# Patient Record
Sex: Female | Born: 1945 | Race: White | Hispanic: No | Marital: Married
Health system: Southern US, Community
[De-identification: ages and names within clinical notes are randomized; demographics above are authoritative.]

---

## 2007-01-12 ENCOUNTER — Ambulatory Visit: Payer: Self-pay | Admitting: Family Medicine

## 2007-05-11 ENCOUNTER — Ambulatory Visit: Payer: Self-pay | Admitting: Family Medicine

## 2007-06-15 ENCOUNTER — Ambulatory Visit: Payer: Self-pay | Admitting: Family Medicine

## 2007-06-27 ENCOUNTER — Ambulatory Visit: Payer: Self-pay | Admitting: Pain Medicine

## 2007-07-07 ENCOUNTER — Ambulatory Visit: Payer: Self-pay | Admitting: Family Medicine

## 2007-08-27 ENCOUNTER — Inpatient Hospital Stay: Payer: Self-pay | Admitting: Internal Medicine

## 2007-08-27 ENCOUNTER — Other Ambulatory Visit: Payer: Self-pay

## 2007-09-03 ENCOUNTER — Ambulatory Visit: Payer: Self-pay | Admitting: Pain Medicine

## 2007-09-04 ENCOUNTER — Ambulatory Visit: Payer: Self-pay | Admitting: Family Medicine

## 2007-09-28 ENCOUNTER — Ambulatory Visit: Payer: Self-pay | Admitting: Family Medicine

## 2007-10-28 ENCOUNTER — Ambulatory Visit: Payer: Self-pay | Admitting: Ophthalmology

## 2007-10-30 ENCOUNTER — Ambulatory Visit: Payer: Self-pay | Admitting: Ophthalmology

## 2007-12-26 ENCOUNTER — Ambulatory Visit: Payer: Self-pay | Admitting: Vascular Surgery

## 2008-06-10 ENCOUNTER — Emergency Department: Payer: Self-pay | Admitting: Emergency Medicine

## 2008-07-03 ENCOUNTER — Ambulatory Visit: Payer: Self-pay | Admitting: Ophthalmology

## 2008-07-08 ENCOUNTER — Ambulatory Visit: Payer: Self-pay | Admitting: Ophthalmology

## 2008-08-18 ENCOUNTER — Ambulatory Visit: Payer: Self-pay | Admitting: Gastroenterology

## 2010-04-18 ENCOUNTER — Ambulatory Visit: Payer: Self-pay | Admitting: Specialist

## 2010-06-25 ENCOUNTER — Inpatient Hospital Stay: Payer: Self-pay | Admitting: *Deleted

## 2010-07-05 ENCOUNTER — Ambulatory Visit: Payer: Self-pay | Admitting: Family Medicine

## 2010-07-20 ENCOUNTER — Other Ambulatory Visit: Payer: Self-pay | Admitting: Family Medicine

## 2010-08-26 ENCOUNTER — Other Ambulatory Visit: Payer: Self-pay | Admitting: Family Medicine

## 2011-01-21 ENCOUNTER — Other Ambulatory Visit: Payer: Self-pay | Admitting: Internal Medicine

## 2011-01-21 LAB — BASIC METABOLIC PANEL
BUN: 39 mg/dL — ABNORMAL HIGH (ref 7–18)
Creatinine: 1.89 mg/dL — ABNORMAL HIGH (ref 0.60–1.30)
EGFR (Non-African Amer.): 28 — ABNORMAL LOW
Glucose: 165 mg/dL — ABNORMAL HIGH (ref 65–99)
Osmolality: 287 (ref 275–301)
Potassium: 3.7 mmol/L (ref 3.5–5.1)
Sodium: 137 mmol/L (ref 136–145)

## 2011-01-22 ENCOUNTER — Other Ambulatory Visit: Payer: Self-pay | Admitting: Family Medicine

## 2011-01-22 LAB — BASIC METABOLIC PANEL
Anion Gap: 8 (ref 7–16)
BUN: 28 mg/dL — ABNORMAL HIGH (ref 7–18)
Chloride: 102 mmol/L (ref 98–107)
Co2: 27 mmol/L (ref 21–32)
Creatinine: 1.44 mg/dL — ABNORMAL HIGH (ref 0.60–1.30)
EGFR (African American): 47 — ABNORMAL LOW
Osmolality: 286 (ref 275–301)
Potassium: 4.1 mmol/L (ref 3.5–5.1)
Sodium: 137 mmol/L (ref 136–145)

## 2011-04-01 ENCOUNTER — Ambulatory Visit: Payer: Self-pay | Admitting: Family Medicine

## 2011-04-01 LAB — URINALYSIS, COMPLETE
Glucose,UR: NEGATIVE mg/dL (ref 0–75)
Nitrite: NEGATIVE
Protein: 100
Specific Gravity: 1.009 (ref 1.003–1.030)
Squamous Epithelial: 1

## 2011-04-05 LAB — URINE CULTURE

## 2011-07-27 ENCOUNTER — Inpatient Hospital Stay: Payer: Self-pay | Admitting: Internal Medicine

## 2011-07-27 LAB — PROTIME-INR
INR: 1
Prothrombin Time: 13.9 secs (ref 11.5–14.7)

## 2011-07-27 LAB — COMPREHENSIVE METABOLIC PANEL
Alkaline Phosphatase: 76 U/L (ref 50–136)
Anion Gap: 11 (ref 7–16)
BUN: 40 mg/dL — ABNORMAL HIGH (ref 7–18)
Co2: 26 mmol/L (ref 21–32)
Creatinine: 1.69 mg/dL — ABNORMAL HIGH (ref 0.60–1.30)
EGFR (African American): 36 — ABNORMAL LOW
EGFR (Non-African Amer.): 31 — ABNORMAL LOW
Glucose: 104 mg/dL — ABNORMAL HIGH (ref 65–99)
Osmolality: 280 (ref 275–301)
Potassium: 3.7 mmol/L (ref 3.5–5.1)
SGOT(AST): 10 U/L — ABNORMAL LOW (ref 15–37)
Sodium: 135 mmol/L — ABNORMAL LOW (ref 136–145)

## 2011-07-27 LAB — CBC
HCT: 32.9 % — ABNORMAL LOW (ref 35.0–47.0)
HGB: 10.4 g/dL — ABNORMAL LOW (ref 12.0–16.0)
MCHC: 31.5 g/dL — ABNORMAL LOW (ref 32.0–36.0)
MCV: 93 fL (ref 80–100)

## 2011-07-27 LAB — HEMOGLOBIN
HGB: 9.4 g/dL — ABNORMAL LOW (ref 12.0–16.0)
HGB: 10.1 g/dL — ABNORMAL LOW (ref 12.0–16.0)

## 2011-07-27 LAB — URINALYSIS, COMPLETE
Ketone: NEGATIVE
Nitrite: NEGATIVE
Protein: 100
RBC,UR: 30 /HPF (ref 0–5)
WBC UR: 4664 /HPF (ref 0–5)

## 2011-07-28 LAB — BASIC METABOLIC PANEL
Anion Gap: 11 (ref 7–16)
BUN: 35 mg/dL — ABNORMAL HIGH (ref 7–18)
Calcium, Total: 9.4 mg/dL (ref 8.5–10.1)
Chloride: 100 mmol/L (ref 98–107)
Co2: 25 mmol/L (ref 21–32)
Creatinine: 1.47 mg/dL — ABNORMAL HIGH (ref 0.60–1.30)
EGFR (Non-African Amer.): 37 — ABNORMAL LOW
Glucose: 232 mg/dL — ABNORMAL HIGH (ref 65–99)
Osmolality: 287 (ref 275–301)
Potassium: 3.7 mmol/L (ref 3.5–5.1)

## 2011-07-28 LAB — CBC WITH DIFFERENTIAL/PLATELET
Basophil #: 0 10*3/uL (ref 0.0–0.1)
Basophil %: 0.3 %
Eosinophil #: 0.2 10*3/uL (ref 0.0–0.7)
HGB: 11 g/dL — ABNORMAL LOW (ref 12.0–16.0)
Lymphocyte %: 22.9 %
MCH: 29.9 pg (ref 26.0–34.0)
MCHC: 32.2 g/dL (ref 32.0–36.0)
MCV: 93 fL (ref 80–100)
Monocyte #: 1.5 x10 3/mm — ABNORMAL HIGH (ref 0.2–0.9)
Neutrophil #: 6.6 10*3/uL — ABNORMAL HIGH (ref 1.4–6.5)
Neutrophil %: 60.8 %
RDW: 14.1 % (ref 11.5–14.5)

## 2011-07-28 LAB — HEMOGLOBIN: HGB: 10.8 g/dL — ABNORMAL LOW (ref 12.0–16.0)

## 2011-07-29 LAB — BASIC METABOLIC PANEL
Anion Gap: 7 (ref 7–16)
BUN: 21 mg/dL — ABNORMAL HIGH (ref 7–18)
Calcium, Total: 8.4 mg/dL — ABNORMAL LOW (ref 8.5–10.1)
Chloride: 106 mmol/L (ref 98–107)
Co2: 28 mmol/L (ref 21–32)
Creatinine: 1.18 mg/dL (ref 0.60–1.30)
EGFR (African American): 56 — ABNORMAL LOW
Potassium: 3.7 mmol/L (ref 3.5–5.1)
Sodium: 141 mmol/L (ref 136–145)

## 2011-07-29 LAB — CBC WITH DIFFERENTIAL/PLATELET
Basophil #: 0 10*3/uL (ref 0.0–0.1)
Basophil %: 0.4 %
Eosinophil %: 2.9 %
HGB: 9.5 g/dL — ABNORMAL LOW (ref 12.0–16.0)
MCH: 29.9 pg (ref 26.0–34.0)
MCV: 92 fL (ref 80–100)
Monocyte #: 1.3 x10 3/mm — ABNORMAL HIGH (ref 0.2–0.9)
Neutrophil %: 43.5 %
RBC: 3.19 10*6/uL — ABNORMAL LOW (ref 3.80–5.20)
RDW: 13.8 % (ref 11.5–14.5)

## 2011-07-29 LAB — URINE CULTURE

## 2011-07-30 LAB — HEMOGLOBIN: HGB: 10.3 g/dL — ABNORMAL LOW (ref 12.0–16.0)

## 2011-07-31 LAB — HEMOGLOBIN: HGB: 10 g/dL — ABNORMAL LOW (ref 12.0–16.0)

## 2011-08-01 LAB — BASIC METABOLIC PANEL
Anion Gap: 7 (ref 7–16)
BUN: 12 mg/dL (ref 7–18)
Chloride: 94 mmol/L — ABNORMAL LOW (ref 98–107)
Creatinine: 1.02 mg/dL (ref 0.60–1.30)
EGFR (Non-African Amer.): 58 — ABNORMAL LOW
Glucose: 283 mg/dL — ABNORMAL HIGH (ref 65–99)
Potassium: 3.8 mmol/L (ref 3.5–5.1)

## 2011-08-02 LAB — CULTURE, BLOOD (SINGLE)

## 2011-09-19 ENCOUNTER — Emergency Department: Payer: Self-pay | Admitting: Emergency Medicine

## 2011-09-19 LAB — COMPREHENSIVE METABOLIC PANEL
Alkaline Phosphatase: 97 U/L (ref 50–136)
Anion Gap: 12 (ref 7–16)
Bilirubin,Total: 0.8 mg/dL (ref 0.2–1.0)
Calcium, Total: 9.3 mg/dL (ref 8.5–10.1)
Chloride: 88 mmol/L — ABNORMAL LOW (ref 98–107)
Co2: 31 mmol/L (ref 21–32)
Creatinine: 1.19 mg/dL (ref 0.60–1.30)
Osmolality: 282 (ref 275–301)
Potassium: 3.5 mmol/L (ref 3.5–5.1)
Sodium: 131 mmol/L — ABNORMAL LOW (ref 136–145)

## 2011-09-19 LAB — CBC
HCT: 43.8 % (ref 35.0–47.0)
HGB: 14.4 g/dL (ref 12.0–16.0)
MCH: 30.2 pg (ref 26.0–34.0)
MCV: 92 fL (ref 80–100)
Platelet: 334 10*3/uL (ref 150–440)
RBC: 4.75 10*6/uL (ref 3.80–5.20)
WBC: 21.5 10*3/uL — ABNORMAL HIGH (ref 3.6–11.0)

## 2011-09-19 LAB — URINALYSIS, COMPLETE
Bilirubin,UR: NEGATIVE
Glucose,UR: >=500 mg/dL (ref 0–75)
Specific Gravity: 1.023 (ref 1.003–1.030)

## 2011-09-20 LAB — URINE CULTURE

## 2011-10-20 ENCOUNTER — Observation Stay: Payer: Self-pay | Admitting: Internal Medicine

## 2011-10-20 LAB — CK TOTAL AND CKMB (NOT AT ARMC)
CK, Total: 54 U/L (ref 21–215)
CK-MB: 1.3 ng/mL (ref 0.5–3.6)
CK-MB: 1 ng/mL (ref 0.5–3.6)

## 2011-10-20 LAB — COMPREHENSIVE METABOLIC PANEL
Albumin: 3.8 g/dL (ref 3.4–5.0)
Anion Gap: 11 (ref 7–16)
BUN: 16 mg/dL (ref 7–18)
Calcium, Total: 9.8 mg/dL (ref 8.5–10.1)
Chloride: 99 mmol/L (ref 98–107)
Co2: 29 mmol/L (ref 21–32)
EGFR (African American): >60
Glucose: 120 mg/dL — ABNORMAL HIGH (ref 65–99)
Osmolality: 280 (ref 275–301)
Potassium: 4.2 mmol/L (ref 3.5–5.1)
Sodium: 139 mmol/L (ref 136–145)
Total Protein: 7.7 g/dL (ref 6.4–8.2)

## 2011-10-20 LAB — CBC
HGB: 13 g/dL (ref 12.0–16.0)
MCHC: 32.7 g/dL (ref 32.0–36.0)
Platelet: 269 10*3/uL (ref 150–440)
WBC: 10.7 10*3/uL (ref 3.6–11.0)

## 2011-12-14 LAB — COMPREHENSIVE METABOLIC PANEL
Albumin: 4 g/dL (ref 3.4–5.0)
Alkaline Phosphatase: 82 U/L (ref 50–136)
Anion Gap: 8 (ref 7–16)
Bilirubin,Total: 0.5 mg/dL (ref 0.2–1.0)
Calcium, Total: 9.7 mg/dL (ref 8.5–10.1)
Glucose: 354 mg/dL — ABNORMAL HIGH (ref 65–99)
Potassium: 3.4 mmol/L — ABNORMAL LOW (ref 3.5–5.1)
SGOT(AST): 22 U/L (ref 15–37)
SGPT (ALT): 21 U/L (ref 12–78)

## 2011-12-14 LAB — CBC WITH DIFFERENTIAL/PLATELET
Basophil %: 0.1 %
Eosinophil %: 0.1 %
HGB: 13.2 g/dL (ref 12.0–16.0)
Lymphocyte #: 3.3 10*3/uL (ref 1.0–3.6)
MCH: 30.3 pg (ref 26.0–34.0)
MCV: 92 fL (ref 80–100)
Monocyte #: 1.6 x10 3/mm — ABNORMAL HIGH (ref 0.2–0.9)
Neutrophil #: 14.7 10*3/uL — ABNORMAL HIGH (ref 1.4–6.5)
Neutrophil %: 74.5 %
RBC: 4.37 10*6/uL (ref 3.80–5.20)

## 2011-12-14 LAB — URINALYSIS, COMPLETE
Bilirubin,UR: NEGATIVE
Blood: NEGATIVE
RBC,UR: 7 /HPF (ref 0–5)
Squamous Epithelial: NONE SEEN
WBC UR: 1 /HPF (ref 0–5)

## 2011-12-14 LAB — CK TOTAL AND CKMB (NOT AT ARMC)
CK, Total: 74 U/L (ref 21–215)
CK-MB: 2.6 ng/mL (ref 0.5–3.6)

## 2011-12-14 LAB — TROPONIN I: Troponin-I: 0.03 ng/mL

## 2011-12-14 LAB — LIPASE, BLOOD: Lipase: 92 U/L (ref 73–393)

## 2011-12-15 ENCOUNTER — Inpatient Hospital Stay: Payer: Self-pay | Admitting: Internal Medicine

## 2011-12-15 LAB — CBC WITH DIFFERENTIAL/PLATELET
Basophil #: 0.1 10*3/uL (ref 0.0–0.1)
Eosinophil #: 0 10*3/uL (ref 0.0–0.7)
HGB: 10.9 g/dL — ABNORMAL LOW (ref 12.0–16.0)
Lymphocyte #: 3.5 10*3/uL (ref 1.0–3.6)
Lymphocyte %: 14.4 %
MCH: 30.3 pg (ref 26.0–34.0)
MCHC: 32.1 g/dL (ref 32.0–36.0)
Monocyte #: 2.1 x10 3/mm — ABNORMAL HIGH (ref 0.2–0.9)
Monocyte %: 8.5 %
Neutrophil #: 18.8 10*3/uL — ABNORMAL HIGH (ref 1.4–6.5)
Platelet: 282 10*3/uL (ref 150–440)
RBC: 3.61 10*6/uL — ABNORMAL LOW (ref 3.80–5.20)
RDW: 14.4 % (ref 11.5–14.5)

## 2011-12-15 LAB — BASIC METABOLIC PANEL
BUN: 12 mg/dL (ref 7–18)
Calcium, Total: 8.4 mg/dL — ABNORMAL LOW (ref 8.5–10.1)
Chloride: 105 mmol/L (ref 98–107)
Co2: 26 mmol/L (ref 21–32)
Glucose: 319 mg/dL — ABNORMAL HIGH (ref 65–99)
Osmolality: 286 (ref 275–301)
Potassium: 4 mmol/L (ref 3.5–5.1)
Sodium: 137 mmol/L (ref 136–145)

## 2011-12-16 LAB — CBC WITH DIFFERENTIAL/PLATELET
Basophil #: 0 10*3/uL (ref 0.0–0.1)
Basophil %: 0.2 %
Eosinophil #: 0.2 10*3/uL (ref 0.0–0.7)
Eosinophil %: 1.9 %
HCT: 31.5 % — ABNORMAL LOW (ref 35.0–47.0)
HGB: 10.1 g/dL — ABNORMAL LOW (ref 12.0–16.0)
Lymphocyte #: 2.9 10*3/uL (ref 1.0–3.6)
MCH: 29.6 pg (ref 26.0–34.0)
MCHC: 32 g/dL (ref 32.0–36.0)
MCV: 92 fL (ref 80–100)
Monocyte #: 1.1 x10 3/mm — ABNORMAL HIGH (ref 0.2–0.9)
Neutrophil #: 7.5 10*3/uL — ABNORMAL HIGH (ref 1.4–6.5)
Neutrophil %: 63.7 %
RBC: 3.41 10*6/uL — ABNORMAL LOW (ref 3.80–5.20)
RDW: 14.4 % (ref 11.5–14.5)

## 2012-04-20 ENCOUNTER — Inpatient Hospital Stay: Payer: Self-pay | Admitting: Internal Medicine

## 2012-04-20 LAB — URINALYSIS, COMPLETE
Bilirubin,UR: NEGATIVE
Glucose,UR: NEGATIVE mg/dL (ref 0–75)
Ph: 8 (ref 4.5–8.0)
Protein: 100
RBC,UR: 48 /HPF (ref 0–5)
Specific Gravity: 1.015 (ref 1.003–1.030)
Transitional Epi: 1
WBC UR: 789 /HPF (ref 0–5)

## 2012-04-20 LAB — CBC
HGB: 12.3 g/dL (ref 12.0–16.0)
MCV: 91 fL (ref 80–100)
Platelet: 328 10*3/uL (ref 150–440)
RBC: 4.08 10*6/uL (ref 3.80–5.20)
RDW: 13.1 % (ref 11.5–14.5)

## 2012-04-20 LAB — COMPREHENSIVE METABOLIC PANEL
Albumin: 3.5 g/dL (ref 3.4–5.0)
Alkaline Phosphatase: 81 U/L (ref 50–136)
BUN: 19 mg/dL — ABNORMAL HIGH (ref 7–18)
Calcium, Total: 9.8 mg/dL (ref 8.5–10.1)
Chloride: 90 mmol/L — ABNORMAL LOW (ref 98–107)
Glucose: 167 mg/dL — ABNORMAL HIGH (ref 65–99)
Osmolality: 269 (ref 275–301)
Potassium: 3.5 mmol/L (ref 3.5–5.1)
SGOT(AST): 11 U/L — ABNORMAL LOW (ref 15–37)
SGPT (ALT): 15 U/L (ref 12–78)

## 2012-04-20 LAB — LIPASE, BLOOD: Lipase: 72 U/L — ABNORMAL LOW (ref 73–393)

## 2012-04-21 LAB — BASIC METABOLIC PANEL
Anion Gap: 6 — ABNORMAL LOW (ref 7–16)
BUN: 11 mg/dL (ref 7–18)
Calcium, Total: 8.1 mg/dL — ABNORMAL LOW (ref 8.5–10.1)
Creatinine: 0.96 mg/dL (ref 0.60–1.30)
EGFR (African American): >60
Glucose: 47 mg/dL — ABNORMAL LOW (ref 65–99)
Sodium: 135 mmol/L — ABNORMAL LOW (ref 136–145)

## 2012-04-21 LAB — CBC WITH DIFFERENTIAL/PLATELET
Basophil #: 0 10*3/uL (ref 0.0–0.1)
Basophil %: 0.1 %
Eosinophil %: 1.4 %
HGB: 9.6 g/dL — ABNORMAL LOW (ref 12.0–16.0)
Lymphocyte #: 3.5 10*3/uL (ref 1.0–3.6)
MCH: 30.7 pg (ref 26.0–34.0)
Monocyte #: 1.4 x10 3/mm — ABNORMAL HIGH (ref 0.2–0.9)
Neutrophil #: 6.2 10*3/uL (ref 1.4–6.5)
Neutrophil %: 55.3 %
Platelet: 266 10*3/uL (ref 150–440)
RDW: 13.2 % (ref 11.5–14.5)

## 2012-04-22 LAB — URINE CULTURE

## 2012-04-24 LAB — PATHOLOGY REPORT

## 2012-04-26 LAB — CULTURE, BLOOD (SINGLE)

## 2012-07-16 ENCOUNTER — Emergency Department: Payer: Self-pay | Admitting: Emergency Medicine

## 2012-07-17 IMAGING — CR DG CHEST 1V PORT
1 series · 1 of 1 positions shown · non-contrast
Comparison: none

REASON FOR EXAM: Line placement
COMMENTS:

PROCEDURE:     DXR - DXR PORTABLE CHEST SINGLE VIEW  - July 27, 2011  [DATE]
RESULT:     Comparison: 07/27/2011

[portable]
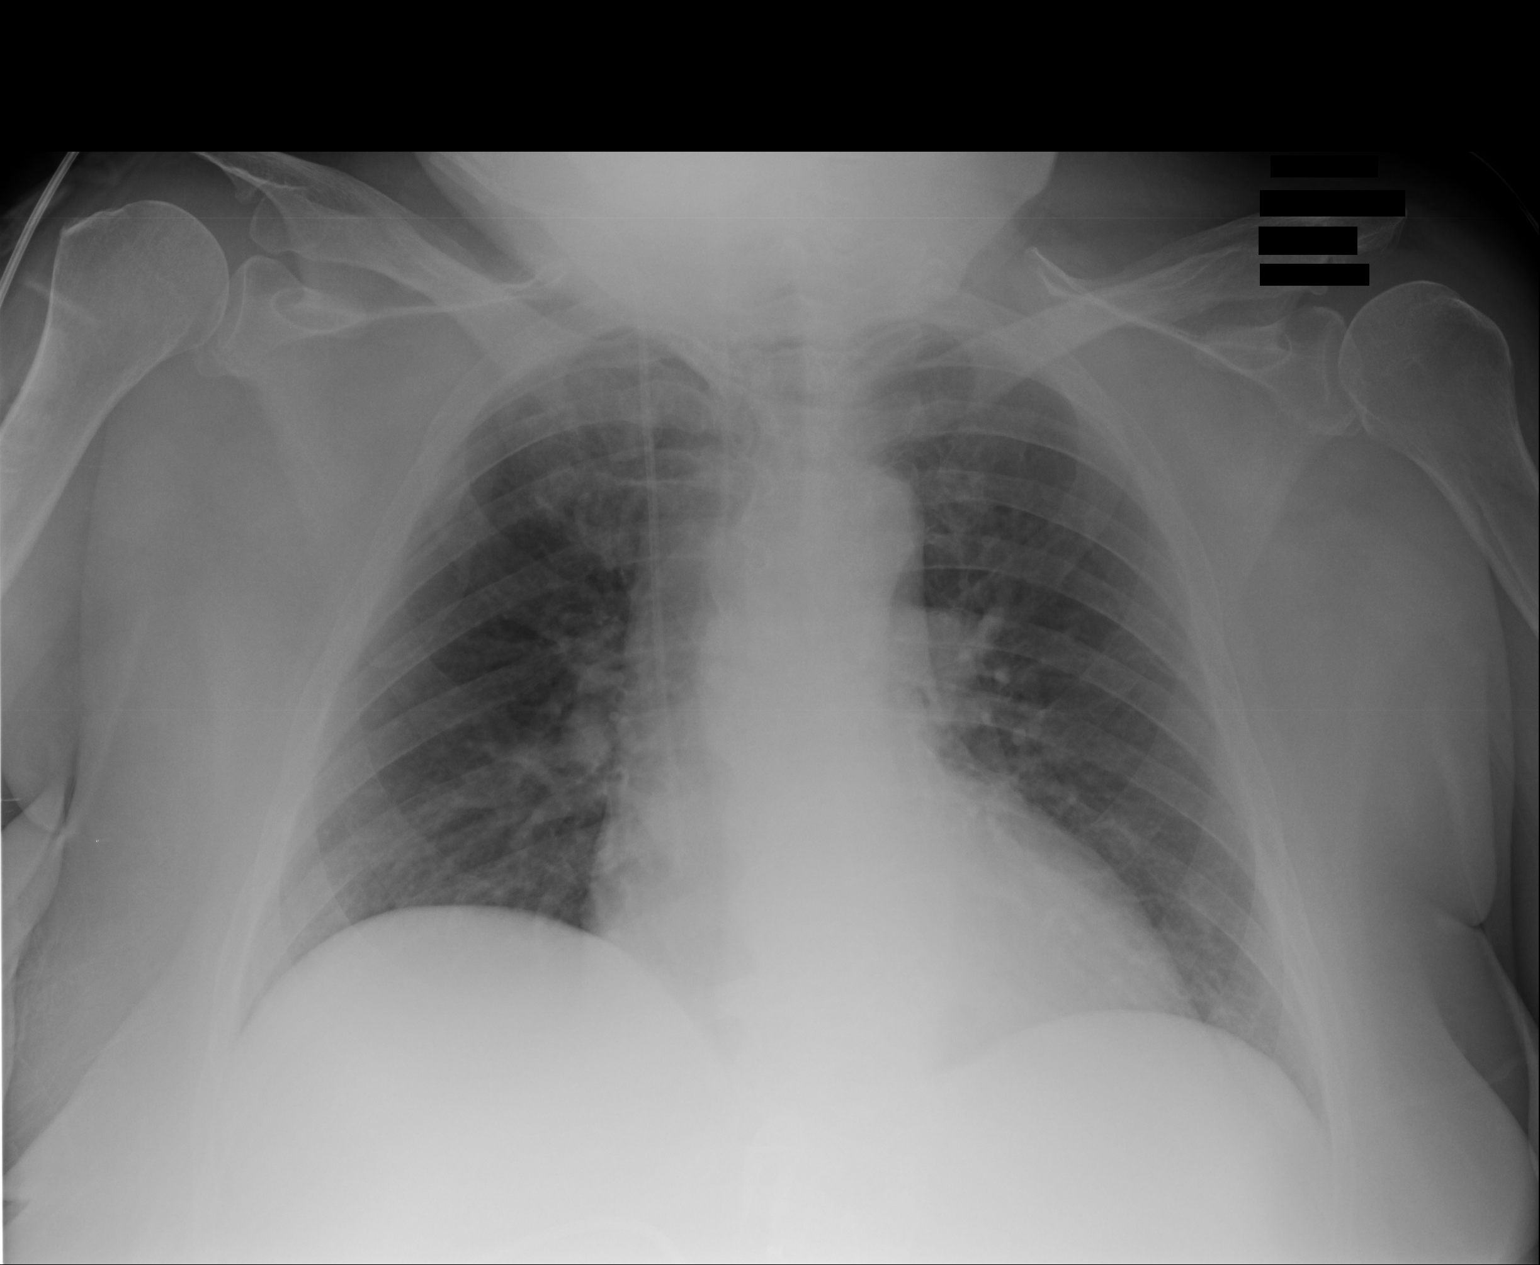

[1 of 1 positions shown; findings below may reference images not displayed]

FINDINGS: Single portable AP chest radiograph is provided. There is a right-sided
central venous catheter with tip projecting over the cavoatrial junction.
There is no focal parenchymal opacity, pleural effusion, or pneumothorax.
Normal cardiomediastinal silhouette. The osseous structures are unremarkable.
IMPRESSION: No acute disease of the che[REDACTED]

## 2012-07-17 IMAGING — CR DG CHEST 1V PORT
1 series · 1 of 1 positions shown · non-contrast
Comparison: none

REASON FOR EXAM: s/p line attempt
COMMENTS:

PROCEDURE:     DXR - DXR PORTABLE CHEST SINGLE VIEW  - July 27, 2011  [DATE]
RESULT:     Comparison: None

[portable]
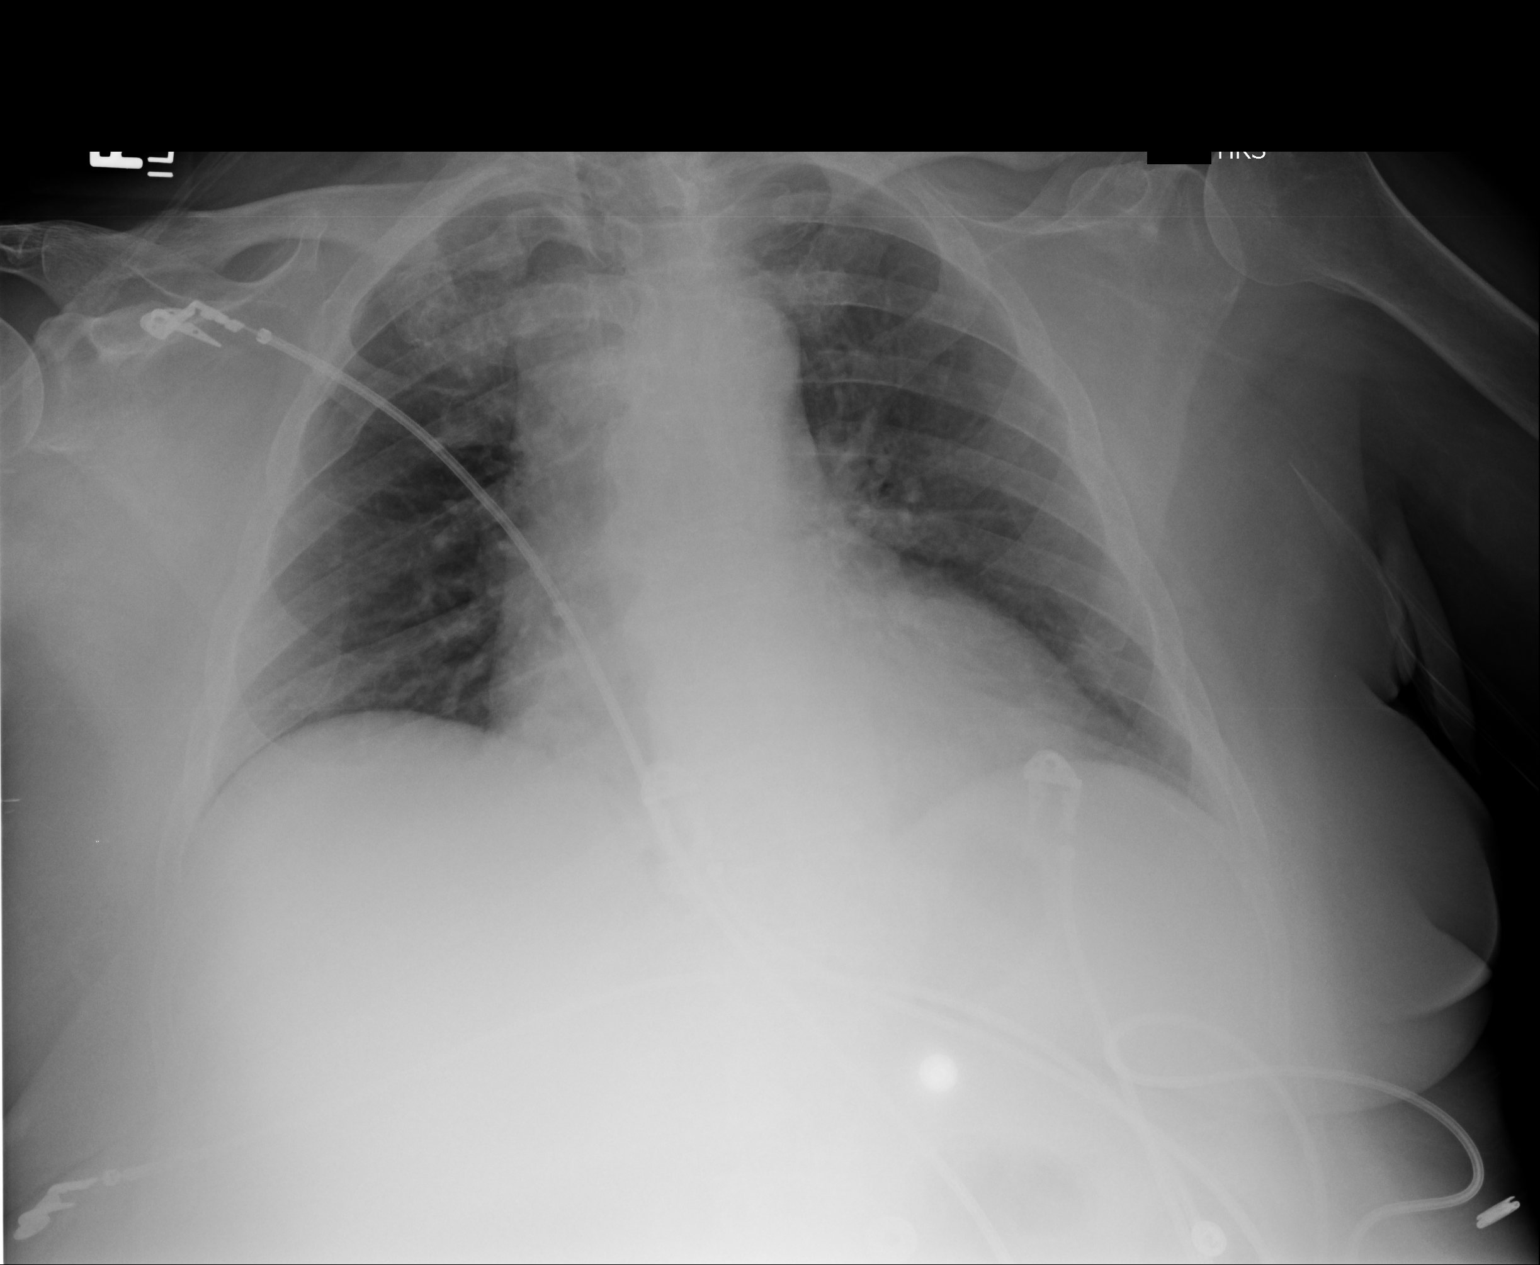

[1 of 1 positions shown; findings below may reference images not displayed]

FINDINGS: Single portable AP chest radiograph is provided.  There is no focal
parenchymal opacity, pleural effusion, or pneumothorax. Normal
cardiomediastinal silhouette. The osseous structures are unremarkable.
IMPRESSION: No acute disease of the che[REDACTED]

## 2012-08-16 DEATH — deceased

## 2012-09-09 IMAGING — CR DG CHEST 2V
1 series · 2 of 2 positions shown · non-contrast
Comparison: none

REASON FOR EXAM: cough
COMMENTS:

[Series 1: x chest ap · 0.14mm/px · 2 of 2 slices shown]
[im 1/2]
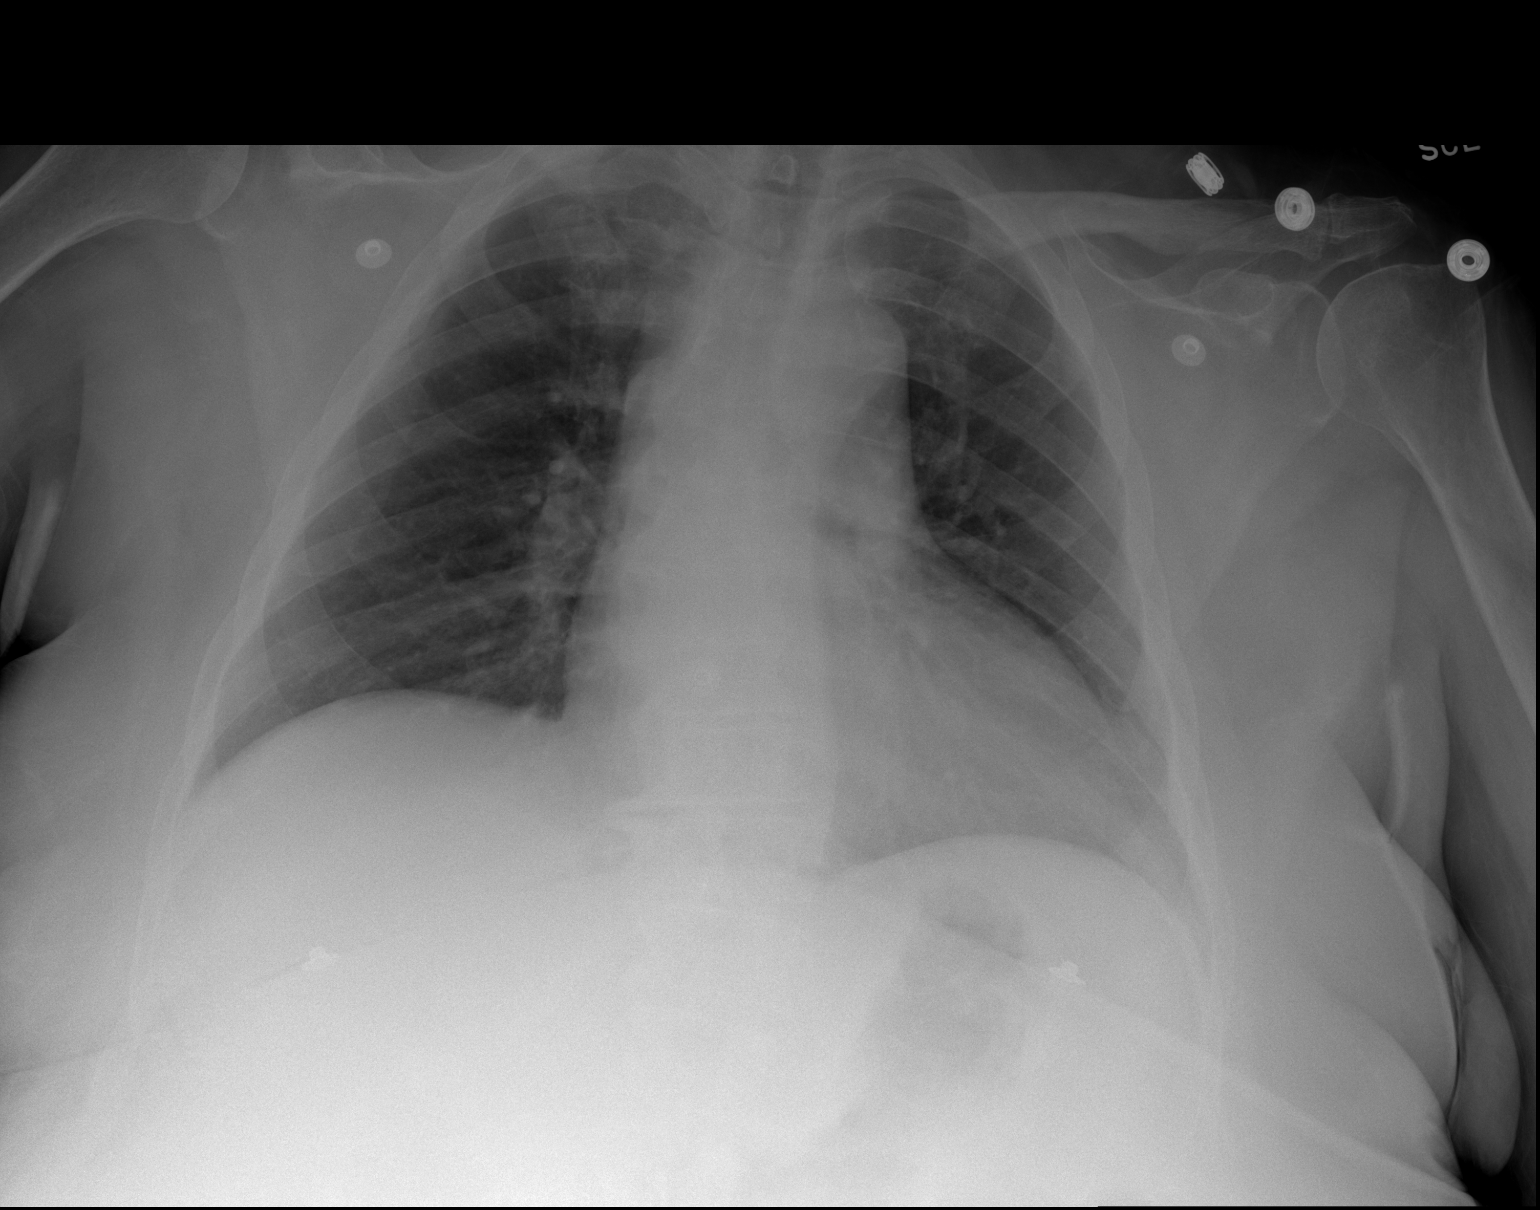
[im 2/2]
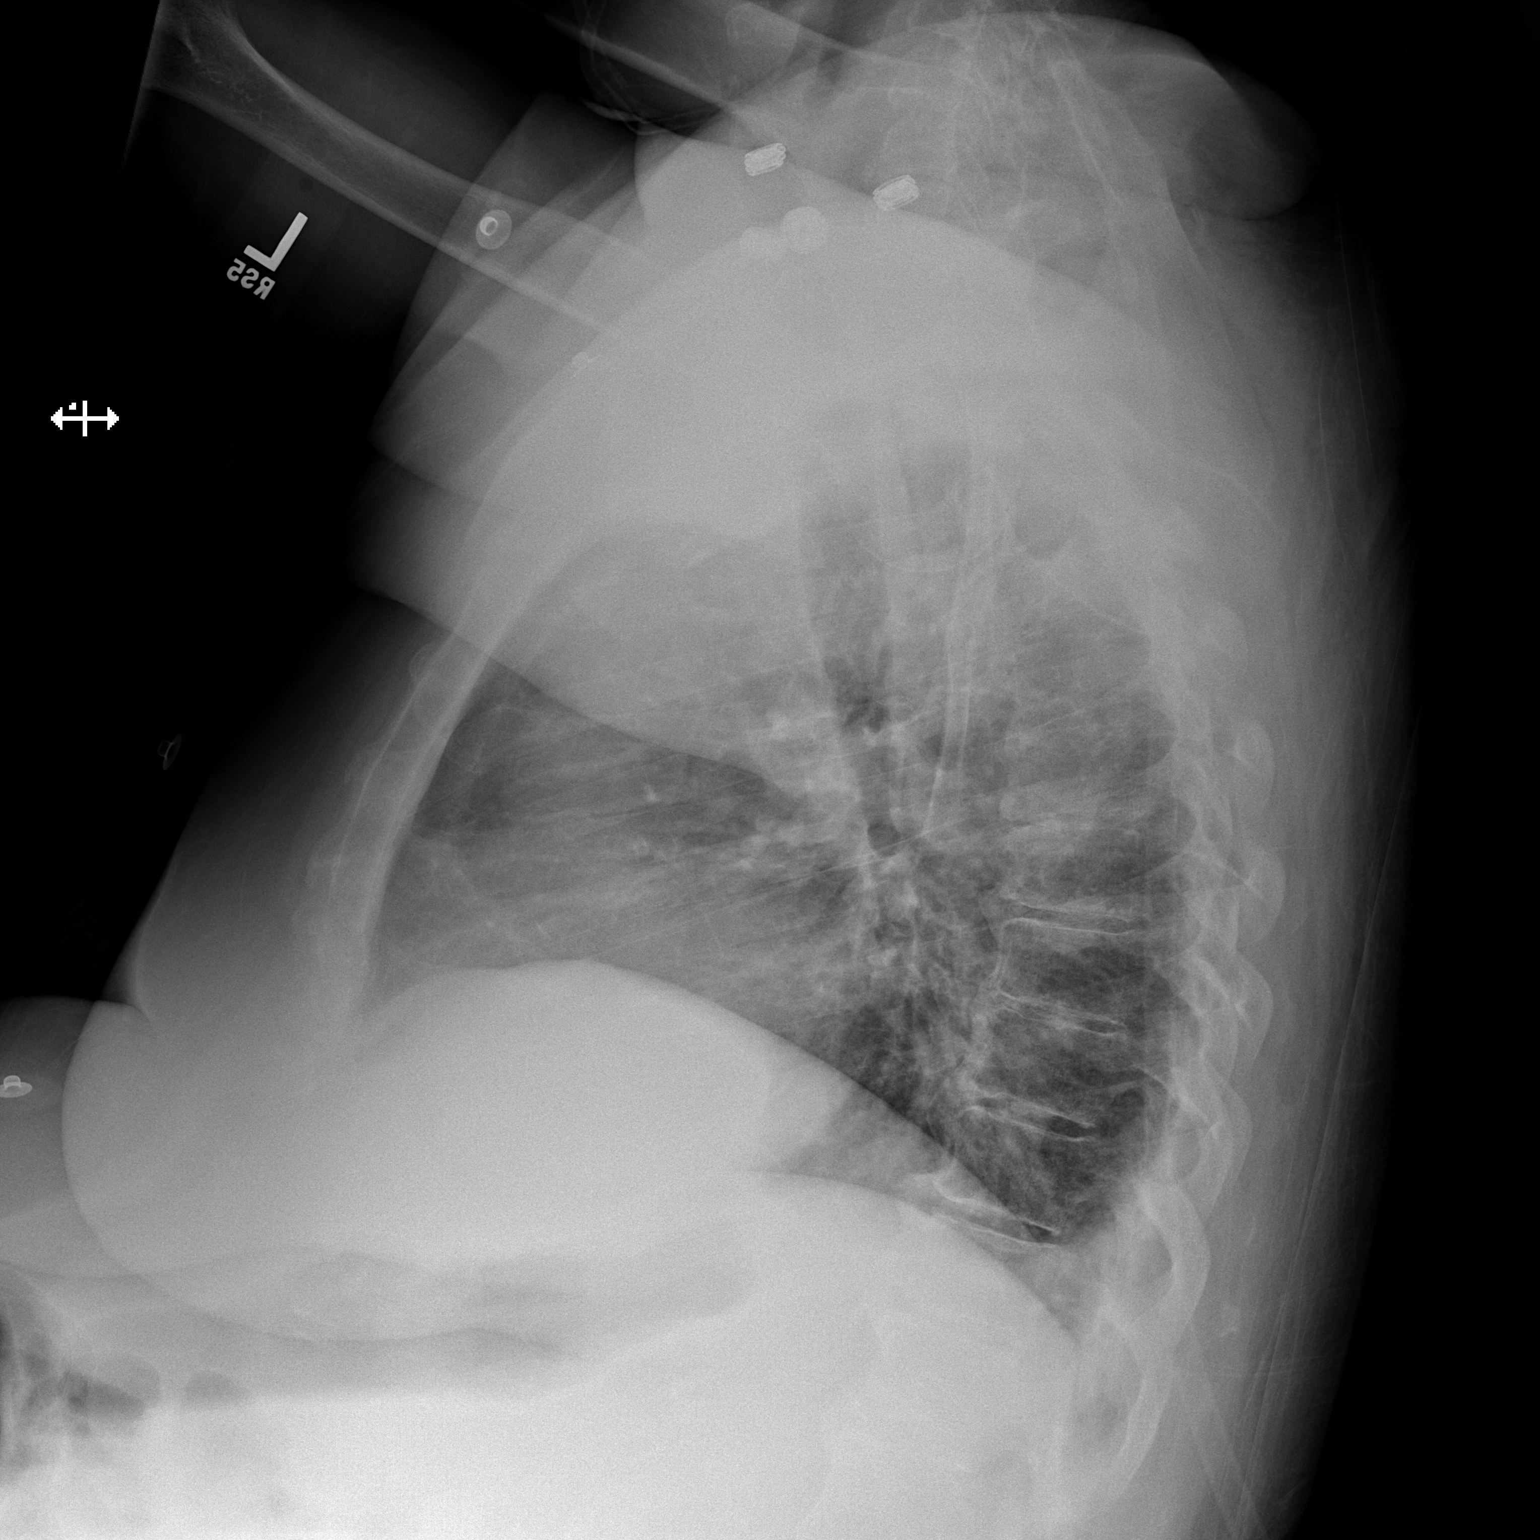

[2 of 2 positions shown; findings below may reference images not displayed]

PROCEDURE:     DXR - DXR CHEST PA (OR AP) AND LATERAL  - September 19, 2011  [DATE]

RESULT:

Comparison is made to a prior study dated 09/27/2011.

The patient has taken a shallow inspiration. With technique taken into
consideration, there is no evidence of focal infiltrates, effusions or
edema. The cardiac silhouette and visualized bony structures are
unremarkable.
IMPRESSION: Shallow inspiration without evidence of acute
cardiopulmonary disease.

## 2012-12-04 IMAGING — CR DG CHEST 2V
1 series · 2 of 2 positions shown · non-contrast
Comparison: none

REASON FOR EXAM: fever
COMMENTS:

[Series 1: x chest ap · 0.14mm/px · 2 of 2 slices shown]
[im 1/2]
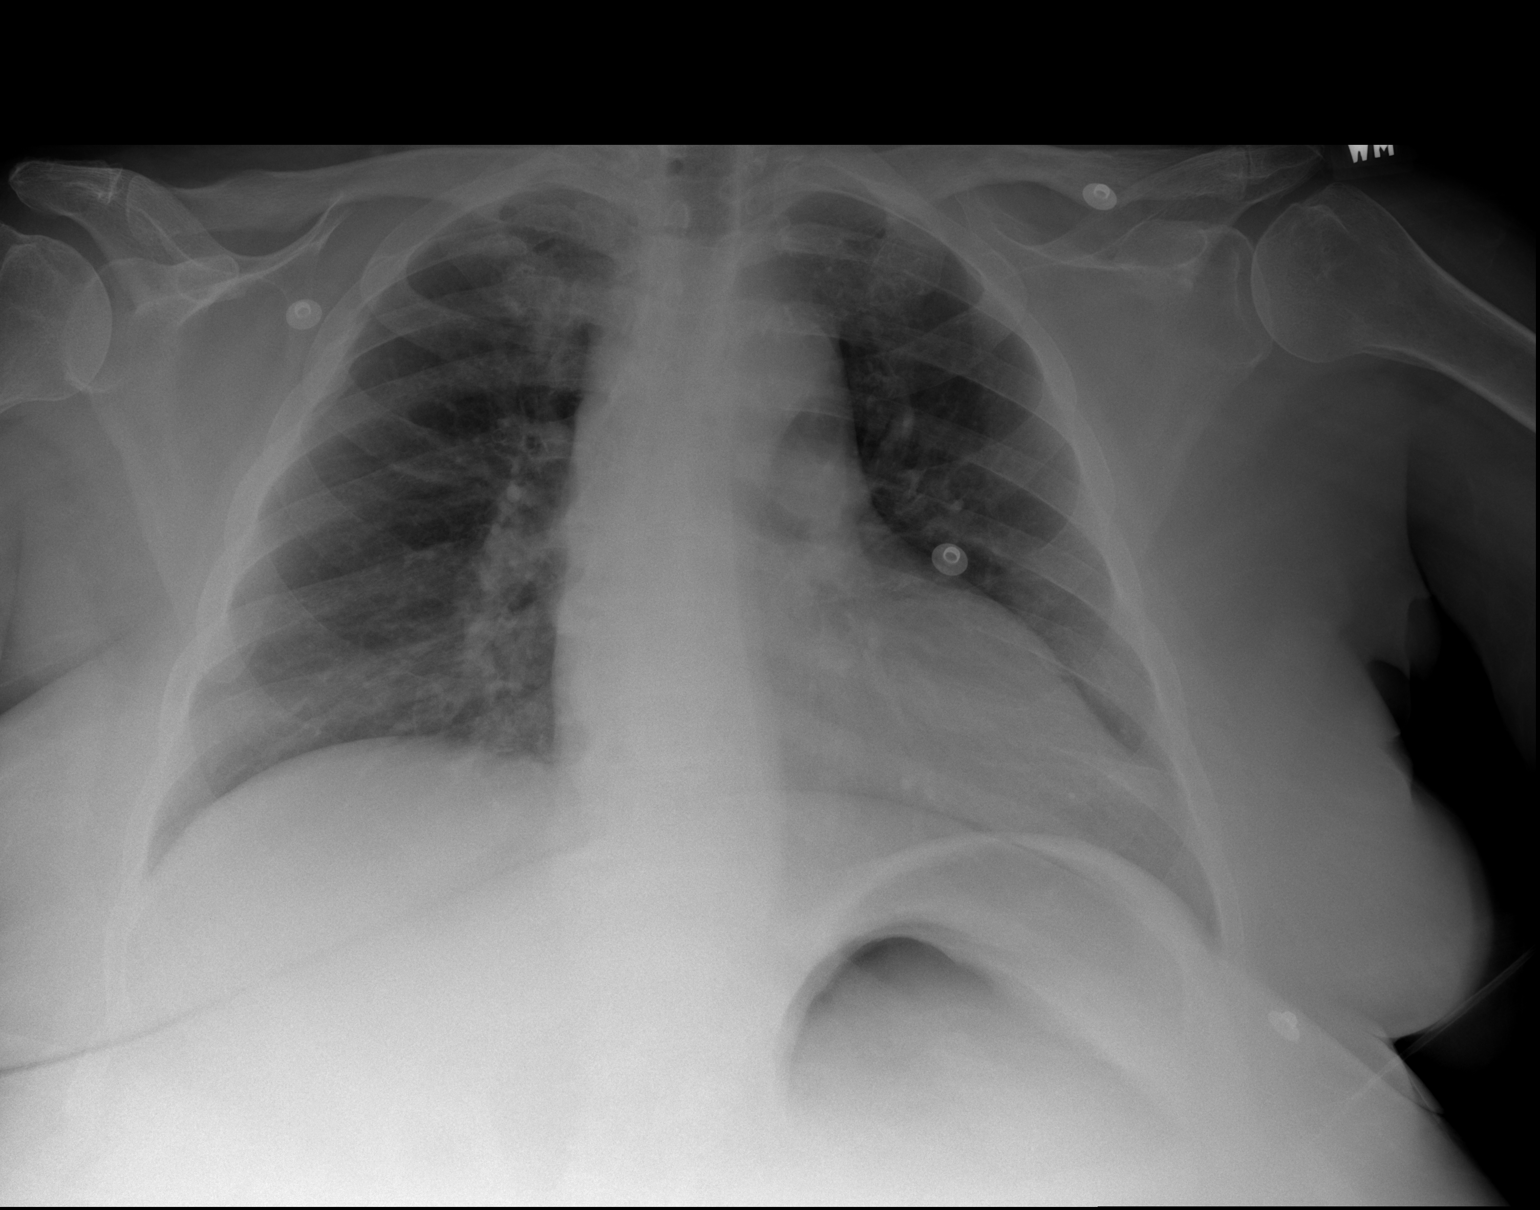
[im 2/2]
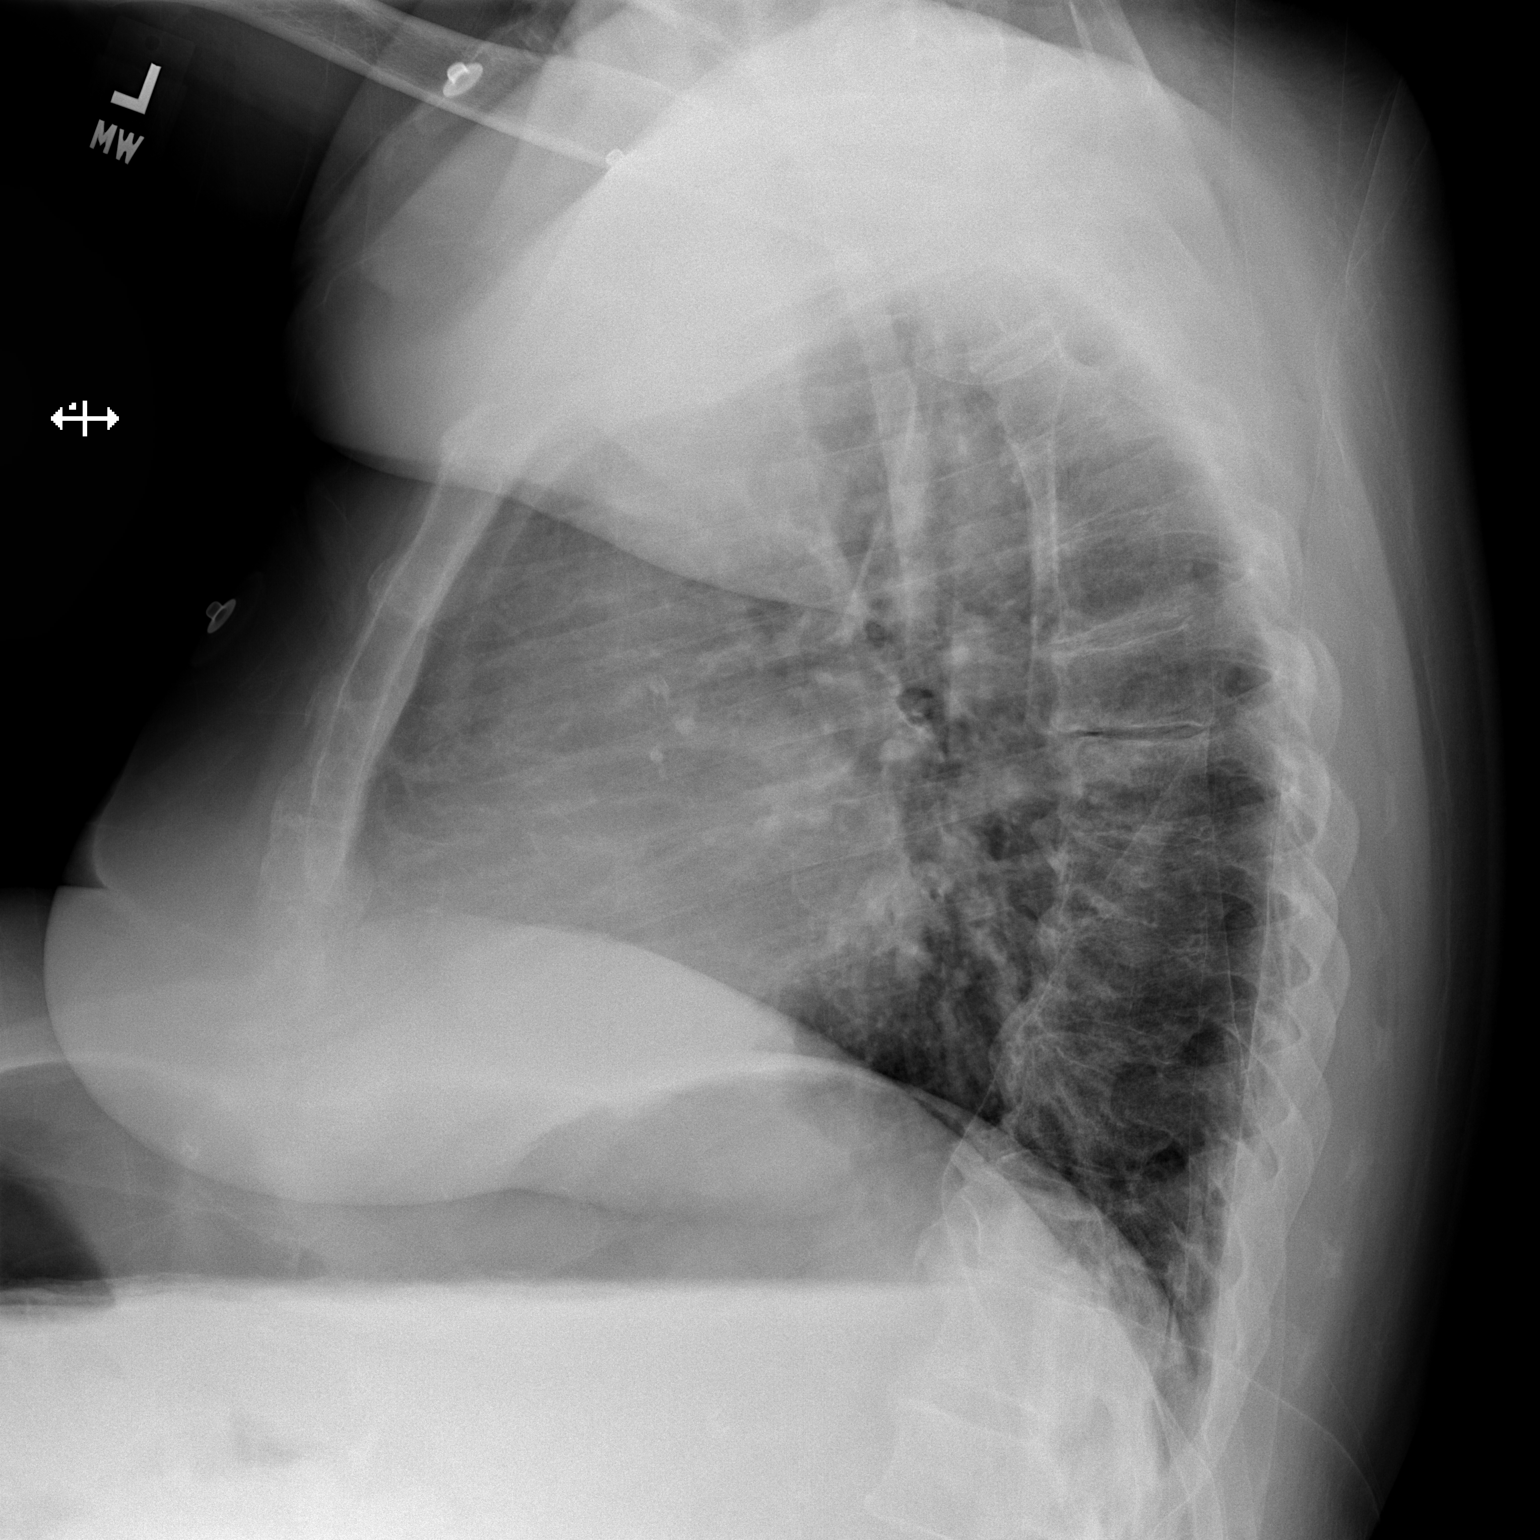

[2 of 2 positions shown; findings below may reference images not displayed]

PROCEDURE:     DXR - DXR CHEST PA (OR AP) AND LATERAL  - December 14, 2011  [DATE]

RESULT:     Comparison is made to study 20 October, 2011.

There is slightly shallow inspiratory effort on the somewhat lordotic
frontal view. The lungs are clear. The heart and pulmonary vessels are
normal. The bony and mediastinal structures are unremarkable. There is no
effusion. There is no pneumothorax or evidence of congestive failure.
IMPRESSION: No acute cardiopulmonary disease.

[REDACTED]

## 2014-05-05 NOTE — Discharge Summary (Signed)
PATIENT NAME:  Mary Werner, Mary Werner MR#:  [id number] DATE OF BIRTH:  1945-12-08  DATE OF ADMISSION:  07/27/2011 DATE OF DISCHARGE:  08/01/2011  ADMITTING DIAGNOSIS: Altered mental status, likely multifactorial.   DISCHARGE DIAGNOSES:  1. Metabolic encephalopathy.  2. Systemic inflammatory response reaction.  3. Acute pyelonephritis.  4. Hypertension.  5. Acute posthemorrhagic anemia due to lower gastrointestinal bleed likely diverticular versus hemorrhoidal, stopped.  6. Status post 1 unit of packed blood cell transfusion.  7. Acute renal failure due to acute pyelonephritis, resolved.  8. Dehydration resolved with intravenous fluids. 9. Hypotension requiring pressors, septic shock, resolved on intravenous fluids as well as antibacterial therapy.  10. History of hyperlipidemia. 11. Hypertension. 12. Diabetes mellitus.  13. Morbid obesity. 14. Cerebrovascular accident. 15. Generalized weakness. 16. Chronic pain syndrome. 17. Anxiety, depression. 18. Urinary incontinence. 19. Right hydroureter. 20. Diabetic neuropathy.   PROCEDURE: Right IJ triple lumen catheter placed on 07/27/2011 by Dr. Michela Pitcher.   DISCHARGE CONDITION: Stable.   DISCHARGE MEDICATIONS: Patient is to resume her outpatient medications which are:  1. Oxycodone 5 mg p.o. twice daily.  2. Milk of magnesia 30 mL at bedtime every second day.  3. Singulair 10 mg p.o. at bedtime.  4. Detrol LA 2 mg p.o. twice daily.  5. Senokot-S 50/8.6 mg 1 tablet twice daily.  6. MS Contin 15 mg p.o. at bedtime.  7. Trazodone 175 mg p.o. at bedtime.  8. Metformin 750 mg p.o. twice daily.  9. Sliding scale insulin Novolin R 28 units injectable once a day. 10. NovoLog mix 70/30, 80 units in the morning and 68 before supper.   11. Abilify 5 mg p.o. daily.  12. Clonazepam 0.5 mg p.o. at bedtime.  13. Simvastatin 10 mg p.o. at bedtime.  14. Magnesium oxide 400 mg p.o. at bedtime.  15. Gabapentin 100 mg at bedtime.  16. Atenolol 50 mg p.o.  twice daily.  17. Vitamin B12 1000 mcg p.o. daily.  18. Calcium with vitamin D 500/200, 1 tablet twice daily.  19. Cranberry oral tablet 1 tablet twice daily.  20. Norvasc 5 mg p.o. daily.  21. Multivitamins once daily.  22. Januvia 100 mg p.o. daily.  23. Klor-Con 20 mEq p.o. daily.  24. Lisinopril 10 mg p.o. daily.  25. Venlafaxine 225 mg p.o. daily.  26. Flonase two sprays once daily.  27. Lamictal 50 mg p.o. twice daily.  28. Iron sulfate 325 mg p.o. twice daily.   ADDITIONAL MEDICATIONS:  1. Keflex 500 mg 3 times daily for six more days.  2. Simethicone 80 mg p.o. before meals and at bedtime as needed.  3. Pyridium 100 mg p.o. t.i.d. for three days.   4. Sliding scale insulin.   NOTE: Patient is not to take Aggrenox for seven days or until recommended by primary care physician.   HOME OXYGEN: None.   DIET: 2 grams salt, low fat, low cholesterol, carbohydrate controlled diet, regular consistency.   ACTIVITY LIMITATIONS: As tolerated. Referral to physical therapy 2 to 7 times a week.   FOLLOW UP: Follow-up appointment with Dr. Terance Hart in two days after discharge.    CONSULTANTS:  1. Dr. Niel Hummer. 2. Dr. Mechele Collin. 3. Care management.  4. Dr. Michela Pitcher.   HISTORY OF PRESENT ILLNESS: Patient is a 69 year old Caucasian female with past medical history significant for history of diabetes mellitus, frequent urinary tract infections who presented to the hospital from [facility] with rectal bleeding as well as being hypotensive, lethargic as well as hypoglycemic. Please refer to  Dr. Hilbert Odor admission note on 07/27/2011. Apparently Patient was lethargic, hypotensive, hypoglycemic and had altered mental status and was admitted to the hospital. Temperature was 98.4 on admission to the Emergency Room, pulse 79, respiration rate 16, blood pressure 97/47 and oxygen saturation was 92% on room air. Physical examination was unremarkable. Patient did have pitting edema in her lower extremities,  however. Was able to move her upper as well as lower extremities.   LABORATORY, DIAGNOSTIC AND RADIOLOGICAL DATA: Chest portable single view 07/27/2011 revealed interstitial pulmonary edema, widening of the mid and superior mediastinum likely secondary to low lung volumes, however, lymphadenopathy could be difficult to exclude. Follow-up PA and lateral chest radiographs are recommended. Repeated chest x-ray portable single view, 07/27/2011 after line attempt showed no acute disease of chest. Repeated chest x-ray, portable single view 07/27/2011 after line placement revealed no acute disease of chest. Right-sided central venous catheter was noted with tip projecting over cavoatrial junction. No focal parenchymal opacity, pleural effusion, or pneumothorax were noted. Normal mediastinal silhouettes was noted. Osseous structures were unremarkable.   Lab studies done on admission, 07/27/2011, showed glucose 104, BUN and creatinine were 40 and 1.69, sodium 135, otherwise BMP was unremarkable. Patient's liver enzymes were remarkable for albumin level of 2.9, otherwise unremarkable. Patient's white blood cell count was elevated to 11.8, hemoglobin 10.4, platelet count 287. Urinalysis revealed yellow turbid urine, negative for glucose, bilirubin or ketones, specific gravity 1.014, pH 5.0, 1+ blood, 100 mg/dL protein, negative for nitrites, 2+ leukocyte esterase, 30 red blood cells, 4664 white blood cells, 3+ bacteria were noted. Patient's EKG showed sinus rhythm with fusion complexes, left axis deviation, 86 beats per minute, left bundle branch block, nonspecific ST-T changes were noted.   HOSPITAL COURSE: Patient was admitted to the hospital with diagnosis of altered mental status likely multifactorial related to hypotension as well as hypoglycemia and infection as well as urinary tract infection and acute renal failure. She was started on IV fluids, however, required pressors, dopamine infusion for blood pressure  control. She had rectal bleed. It was concerning that patient could have had significant bleed and that was contributing to her hypertension. However, with therapy with IV fluid administration as well as dopamine and transfusion of 1 unit of packed red blood cells patient's condition stabilized especially when antibiotics were initiated. It was felt, however, that patient's hypotension was most related to infection, acute pyelonephritis as well as systemic inflammatory response reaction. Patient's blood cultures remained negative during her stay in the hospital time, however, patient's urine cultures came back Escherichia coli sensitive to trimethoprim sulfamethoxazole, cefazolin, also ceftriaxone and imipenem, however, resistant to ampicillin, ciprofloxacin, gentamicin and levofloxacin, intermediate sensitive to nitrofurantoin. Patient was initiated on Rocephin and tolerated this antibiotic well. With this therapy she improved significantly.   In regards to altered mental status. As mentioned above patient's altered mental status was multifactorial, was felt to be due to hypotension, hypoglycemia as well as infection. Patient's all metabolic problems resolved as time progressed patient's neurologic status improved. On day of discharge she is able to communicate. She is alert and seemed to be understanding her current condition. She is able to be discharged to skilled nursing facility to improve her strength with physical therapy.   In regards to gastrointestinal bleed, gastroenterologist was consulted. Dr. Mechele Collin as well as Dr. Niel Hummer followed patient along. Both of them felt that patient's GI bleed very likely was diverticular or hemorrhoidal in nature. They did not feel that patient needs any luminal study but  just follow up. After 1 unit of packed red blood cell transfusion patient's hemoglobin remained stable and after her Aggrenox was placed on hold patient's bleeding slowly subsided. She was noted to  have anemia on arrival to the Emergency Room. Hemoglobin level was 10.4. She was transfused and her hemoglobin level remained stable. On 07/31/2011 it was checked, was 10.0. Patient is to continue iron supplements to restore her iron stores. She is to have her hemoglobin checked sometime in the future by her primary care physician to ensure that it is stable. Patient is to resume her Aggrenox in approximately one week after discharge.   In regards to acute pyelonephritis, patient's urine cultures came back Escherichia coli. Initially the patient was started on ceftriaxone, she received four day therapy of ceftriaxone. Patient's antibiotic now is being changed to Keflex. She is to take Keflex for six more days to complete 10 day course. She will be discharged to rehab today.   In regards to acute renal failure, it was felt to be likely ATN related to hypotension as well as volume loss. Patient received IV fluids as well as pressors and her creatinine improved. On 08/01/2011 patient's BUN and creatinine were 12 and 1.02. It is recommended to follow patient's creatinine levels to ensure its stability especially now since she is back on ACE inhibitors.   For hypotension, it was felt to be due to hypovolemia and sepsis. She was on dopamine as mentioned above, initially, however, patient's dopamine was weaned after a few days in Critical Care Unit and patient was transferred to regular medical floor. She was initially continued on IV fluids, whenever she is able to take p.o. her IV fluids were discontinued. As mentioned above she was hypotensive initially, however, became hypertensive after IV fluids were supplemented. Her blood pressure medications were restarted. It is recommended to follow patient's blood pressure readings and advance patient's blood pressure medications to ensure good control.  In regards to hypoglycemia in diabetic patient, initially patient's metformin, Januvia as well as insulin were placed  on hold, just sliding scale was initiated, however, when patient's oral intake improved all her medications were resumed.   In regards to chronic pain syndrome, initially patient's pain medications were placed on hold, however, later on patient's MS Contin was resumed.  In regards to anxiety, depression, patient is to continue Abilify and Lamictal as well as Klonopin. No changes were made and patient did quite well here.   For hyperlipidemia, patient is to continue simvastatin.   For constipation, patient is to continue stool softeners. Patient will be receiving enema today, on 08/01/2011, prior to discharge and then later if she does have good bowel movement she will be discharged. She did have some diarrhea on arrival to the hospital, however, her diarrhea resolved and now she is constipated.   Patient was complaining of some cough. It was unclear if she had any acute bronchitis, however, we were not able to get any sputum cultures, moreover, she was treated with Rocephin and now she is on Keflex. No complaints over the past few days. No cough and chest x-rays were negative for any infiltrates.   As mentioned above, patient was having diarrheal stool initially, however, stool cultures were negative for C. difficile and stool cultures were also negative for Salmonella, Shigella or Campylobacter or pathogenic Escherichia coli in 48 hours. Patient's diarrhea subsided and patient became even constipated by the end of her stay in the hospital.   On the day of discharge patient  felt satisfactory, did not complain of any significant discomfort. Her vitals were stable. Temperature 97.8, pulse 81, respiratory rate 20, blood pressure 164/88, saturation 98% on 2 liters of oxygen through nasal cannula.   TIME SPENT: 40 minutes.   ____________________________ Katharina Caper, MD rv:cms D: 08/01/2011 14:21:09 ET T: 08/01/2011 15:16:57 ET JOB#: [id number]  cc: Katharina Caper, MD, <Dictator> Teena Irani. Terance Hart,  MD  Katharina Caper MD ELECTRONICALLY SIGNED 08/06/2011 18:42

## 2014-05-05 NOTE — Consult Note (Signed)
CC: abd pain.  Pt with cond abd pain maybe a little better, can cough deeply without signif pain.  On my exam she has some tenderness, no signif guarding.  Also has pain in back.  Enteritis on CT could be infection, can't rule out focal ischemia.  Pt has had 2 strokes in past and complicated diabetes and lives in a rest home for medical assistance.  WBC has come down from 24.6-11.8 on fluids and antibiotics.  Continue current therapy,  Pt asking for something for pain.  Electronic Signatures: Scot JunElliott, Clemie General T (MD)  (Signed on 30-Nov-13 11:45)  Authored  Last Updated: 30-Nov-13 11:45 by Scot JunElliott, Camilo Mander T (MD)

## 2014-05-05 NOTE — H&P (Signed)
PATIENT NAME:  Mary Werner, Mary Werner MR#:  [id number] DATE OF BIRTH:  03-Nov-1945  DATE OF ADMISSION:  12/14/2011  PRIMARY CARE PHYSICIAN: Dr. Dorothey Baseman  CHIEF COMPLAINT: Nausea, vomiting.   HISTORY OF PRESENT ILLNESS: This is a 69 year old female who currently resides at [facility] skilled nursing facility presented to the hospital due to some persistent nausea and vomiting. Patient herself is somewhat lethargic and altered therefore most of the history obtained from the chart and ER physician and also on calling the facility. Upon calling [facility] the nurse notified me that patient had some projectile vomiting earlier today and requested she go to the Emergency Room. In the Emergency Room patient has had no further nausea or vomiting but has been noted to be persistently tachycardic despite getting aggressive IV fluids. She was noted to have a leukocytosis of 19,000. She also seemed a bit lethargic and therefore hospitalist service was contacted for further treatment and evaluation. Patient does complain of some diffuse abdominal pain presently. She denies any chest pain, shortness of breath, any fevers, chills, cough or any other associated symptoms presently. Patient underwent a CT scan of the abdomen and pelvis which showed small bowel enteritis. Hospitalist services were then contacted for further treatment and evaluation.   REVIEW OF SYSTEMS: CONSTITUTIONAL: No documented fever. No weight gain. No weight loss. EYES: No blurred or double vision. ENT: No tinnitus. No postnasal drip. No redness of the oropharynx. RESPIRATORY: No cough, no wheeze, no hemoptysis, no dyspnea. CARDIOVASCULAR: No chest pain, no orthopnea, no palpitations, no syncope. GASTROINTESTINAL: Positive nausea. Positive vomiting. No diarrhea. Positive abdominal pain, generalized. No melena, no hematochezia. GENITOURINARY: No dysuria, no hematuria. ENDOCRINE: No polyuria or nocturia. No heat or cold intolerance. HEME: No anemia,  no bruising, no bleeding. INTEGUMENTARY: No rashes. No lesions. MUSCULOSKELETAL: No arthritis, no swelling, no gout. NEUROLOGIC: No numbness, no tingling, no ataxia, no seizure-type activity. PSYCH: Positive depression. No anxiety, no insomnia, no ADD.   PAST MEDICAL HISTORY:  1. Diabetes.  2. Hypertension.  3. Hyperlipidemia.  4. Chronic pain syndrome, narcotic dependence.  5. Depression.  6. Urinary incontinence.  7. Diabetic neuropathy.   ALLERGIES: Patient allergic to Septra, sulfa drugs, tetracycline and Terramycin.   SOCIAL HISTORY: No smoking. No alcohol abuse. No illicit drug abuse. Currently resides at skilled nursing facility.   FAMILY HISTORY: Currently unobtainable, although she does say that both mother and father died from complications of heart problems, but I am not sure how reliable this is.   CURRENT MEDICATIONS: Patient's current medications are as follows:  1. Abilify 10 mg 1/2 tab daily.  2. Aggrenox 1 tab b.i.d.  3. Atenolol 50 mg b.i.d.  4. Calcium vitamin D 1 tab b.i.d.  5. Chloraseptic spray every four hours as needed.  6. Klonopin 0.5 mg at bedtime.  7. Cranberry  oral tablet 1 tab b.i.d.  8. Flexeril 5 mg q.8 hours as needed.  9. Detrol 2 mg b.i.d.  10. Effexor 225 mg daily.  11. Iron sulfate 325 mg daily. 12. Januvia 100 mg daily.  13. Potassium 20 mEq daily. 14. Lamictal 25 mg 2 tabs twice daily.  15. Lisinopril 20 mg daily.  16. Magnesium oxide 400 mg daily.  17. Metformin 500 mg 1-1/2 tab b.i.d.  18. Milk of magnesia as needed.  19. MS Contin 15 mg b.i.d.  20. Multivitamin daily.  21. Neurontin 100 mg at bedtime.  22. Norvasc 5 mg daily.  23. Novolin regular insulin 28 units daily at noon.  24. NovoLog 70/30, 72 units in the morning, 60 units in the evening.  25. Oxycodone 5 mg b.i.d.  26. Oxycodone 5 mg b.i.d. as needed for pain.  27. Senokot 1 tab b.i.d.  28. Simethicone 80 mg q.i.d. as needed.  29. Simvastatin 10 mg daily.  30. Singulair  10 mg daily.  31. Trazodone 175 mg at bedtime.  32. Victoza 18 mg/3 mL 1.2 mg subcutaneous daily. 33. Vitamin B12 1000 mcg daily.   PHYSICAL EXAMINATION ON ADMISSION:  VITAL SIGNS: Patient's vital signs are noted to be: Temperature 98.2, pulse 124, respirations 22, blood pressure 189/84, sats 97% on 2 liters nasal cannula.   GENERAL: She is a lethargic-appearing female but in no apparent distress.   HEENT: She is atraumatic, normocephalic. Pupils are pinpoint but reactive.   NECK: Supple. There is no jugular venous distention, no bruits, no lymphadenopathy, no thyromegaly.   HEART: Regular rate and rhythm. Tachycardic. No murmurs, no rubs, no clicks.   LUNGS: Clear to auscultation bilaterally. No rales, no rhonchi, no wheezes.   ABDOMEN: Soft, flat, slightly distended. Hyperactive bowel sounds. No hepatosplenomegaly appreciated.   EXTREMITIES: No evidence of any cyanosis, clubbing, or peripheral edema. +2 pedal and radial pulses bilaterally.   NEUROLOGIC: Patient is alert, awake, oriented x1. Difficult to do full neurological exam but moves all extremities spontaneously. As per the nursing staff at the facility she is wheelchair bound.   SKIN: Moist, warm with no rashes.   LYMPHATIC: There is no cervical or axillary lymphadenopathy.   LABORATORY, DIAGNOSTIC AND RADIOLOGICAL DATA: Serum glucose 354, BUN 18, creatinine 1.04, sodium 135, potassium 3.4, chloride 94, bicarbonate 33. LFTs are within normal limits. Troponin 0.03. White cell count 19.7, hemoglobin 13.2, hematocrit 40.2, platelet count 317. Urinalysis within normal limits.   Patient's CT scan of the abdomen and pelvis is suggestive of a small bowel regional enteritis. No evidence of bowel obstruction.   ASSESSMENT AND PLAN: This is a 69 year old female with a history of diabetes, hypertension, chronic pain syndrome and narcotic dependence, diabetic neuropathy, urinary incontinence, hyperlipidemia, depression presents to the  hospital from [facility] secondary to persistent nausea, vomiting, and CT findings suggestive of enteritis.  1. Nausea, vomiting with likely gastroenteritis. Patient's CT scan of the abdomen and pelvis is suggestive of small bowel enteritis. I suspect this is all likely viral in nature. She has no fever but does have a slightly elevated white cell count. For now will continue supportive care with IV fluids, antiemetics. I will avoid giving her too much pain control as she is quite lethargic, is on chronic narcotics. At this point will follow her clinically. I will place her on a clear liquid diet for now.  2. Altered mental status/drowsiness. The etiology is currently unclear but I think this is likely related to polypharmacy. Patient is on high dose narcotics along with multiple psych medications. For now will continue her psych medications but hold her narcotics for now.  3. Leukocytosis. This is likely secondary to the viral gastroenteritis. I will repeat her white cell count in the morning. Hold off on giving any IV antibiotics at this point.  4. Hypertension. Continue lisinopril. Continue atenolol. Patient presently hemodynamically stable.  5. Diabetes. I will place her on sliding scale insulin for now, hold her Victoza, metformin and her scheduled 70/30 insulin given the fact that she will be on a clear liquid diet.  6. Chronic pain syndrome. Patient is on multiple narcotics therefore I will hold those for now given  her mental status and her lethargy.  7. Diabetic neuropathy. Continue Neurontin.  8. Depression. Continue with her Effexor, Abilify and trazodone for now.  9. Hyperlipidemia. Continue simvastatin.  10. Urinary incontinence. Continue Detrol.  11. CODE STATUS: Patient is a FULL CODE.       TIME SPENT: 50 minutes.  ____________________________ Rolly PancakeVivek J. Cherlynn KaiserSainani, MD vjs:cms D: 12/14/2011 20:26:59 ET T: 12/15/2011 06:20:13 ET JOB#: [id number]  cc: Rolly PancakeVivek J. Cherlynn KaiserSainani, MD,  <Dictator> Teena Iraniavid M. Terance HartBronstein, MD Houston SirenVIVEK J Allannah Kempen MD ELECTRONICALLY SIGNED 12/15/2011 14:58

## 2014-05-05 NOTE — Consult Note (Signed)
CC: enteritis.  Pt says her abd pain is pretty much gone, none at the present.  Abd exam shows no peritoneal signs and no signif tenderness to deep palpation.  Can advance diet, home soon on antibiotics and avoid things like seeds, nuts, popcorn.  Should finish a full course of antibiotics.  Electronic Signatures: Scot JunElliott, Josaiah Muhammed T (MD)  (Signed on 01-Dec-13 12:18)  Authored  Last Updated: 01-Dec-13 12:18 by Scot JunElliott, Quintin Hjort T (MD)

## 2014-05-05 NOTE — Discharge Summary (Signed)
PATIENT NAME:  Mary Werner, Mary Werner MR#:  [id number] DATE OF BIRTH:  1945-08-05  DATE OF ADMISSION:  12/15/2011 DATE OF DISCHARGE:  12/19/2011  DISCHARGE DIAGNOSES:  1. Enteritis with nausea and vomiting.  2. Leukocytosis due to above.  3. Altered mental status and drowsiness possibly due to polypharmacy and multiple narcotics and benzodiazepine.  4. Hypertension.  5. Diabetes.  6. Chronic pain syndrome. 7. Diabetic neuropathy. 8. Depression. 9. Hyperlipidemia. 10. Urinary incontinence. 11. History of cerebrovascular accident in the past.   DISPOSITION: The patient is being discharged to her skilled nursing facility.   DIET: Low sodium, 1800 calorie ADA diet.   ACTIVITY: As tolerated.   DISCHARGE FOLLOWUP: Followup with Dr. Terance Hart in 1 to 2 weeks after discharge.   DISCHARGE MEDICATIONS:  1. Aggrenox 1 capsule twice a day. 2. Abilify 10 mg 1/2 tablet once a day. 3. Atenolol 50 mg one tablet twice a day.  4. Calcium with vitamin D 500/200 one tablet twice a day. 5. Cranberry oral tablet 450 mg twice a day. 6. Detrol 2 mg twice a day.  7. Januvia 100 mg daily.  8. KCl 20 mEq daily.  9. Lamictal 50 mg twice a day. 10. Lisinopril 20 mg daily.  11. Magnesium oxide 400 mg daily.  12. Metformin 750 mg twice a day. 13. Multivitamin 1 tablet daily.  14. Neurontin 100 mg at bedtime.  15. Norvasc 5 mg daily.  16. NovoLog 70/30 72 units in the morning and 60 units in the evening.  17. Senokot S one tablet twice a day.  18. Simvastatin 10 mg daily.  19. Singulair 10 mg daily. 20. Effexor XR 225 mg once a day.  21. Milk of Magnesia 30 mL every other day at bedtime. 22. Trazodone 175 mg at bedtime. 23. Vitamin B12 1000 mcg daily. 24. Victoza 1.2 mg subcutaneously once a day.  25. Ferrous sulfate 325 mg daily.  26. Simethicone 80 mg four times daily as needed.  27. Chloraseptic spray every 4 hours as needed for sore throat.  28. Oxycodone 5 mg every 4 hours p.r.n. pain. 29. Flagyl  500 mg every 8 hours for 10 days.  30. Cipro 500 mg every 12 hours for 10 days. 31. Tylenol 650 mg every four hours p.r.n.  32. Zofran 4 mg every six hours p.r.n. for nausea and vomiting. 33. DuoNebs every 6 hours p.r.n. wheezing. 34. MiraLax 17 grams daily p.r.n. for constipation.   CONSULTANT: Lurline Del, MD - Gastroenterology.   RESULTS: CT of the abdomen showed findings compatible with small bowel enteritis, large gastric air fluid level which may represent a component of paresis, which could be reactive considering the small bowel findings. Stable cystic right adnexal mass. Nodular thickening of the posterior wall of the rectum on the left. Thickening of the distal esophagus, nonspecific, although esophagitis cannot be excluded.  Chest x-ray showed no acute cardiopulmonary pathology.   Microbiology: Blood cultures no growth so far.   Urinalysis showed no evidence of infection.   White count 19.7 on admission and 11.8 by the time discharge. Hemoglobin 13.2 to 10. Platelet count normal. Glucose 319, BUN 18, creatinine 1.04, sodium normal by the time of discharge, and potassium 3.4 and normal by the time of discharge. Lipase normal. LFTs normal. Cardiac enzymes normal.   HOSPITAL COURSE: The patient is a 69 year old female with history of cerebrovascular accident in the past, diabetes, hypertension, and chronic pain syndrome who was admitted for altered mental status and nausea and vomiting. The altered  mental status was felt to be due to polypharmacy. The patient was on multiple medications including Neurontin, trazodone, Effexor, MS Contin, Klonopin, and oxycodone for pain. All her narcotic and anxiolytic medications were placed on hold. The patient was back to her baseline within 24 hours of doing so. She had nausea, vomiting, and abdominal pain. CT of the abdomen showed small bowel enteritis. Her white count was significantly elevated, therefore, she was started on empiric antibiotics  of Cipro and Flagyl with good response. She was also evaluated by Dr. Niel HummerIftikhar and Dr. Mechele CollinElliott since she had additional findings on the CT including possible gastroparesis and thickening of the distal esophagus. No luminal evaluation was recommended or planned. With conservative management and antibiotics, the patient's nausea, vomiting, and abdominal pain resolved. She was tolerating a regular diet by the time of discharge. Her other medical problems including hypertension, diabetes, and depression remained stable. She is being discharged to her skilled facility in stable condition.   TIME SPENT: 45 minutes.  ____________________________ Darrick MeigsSangeeta Tierney Behl, MD sp:slb D: 12/19/2011 08:23:41 ET T: 12/19/2011 08:57:51 ET JOB#: [id number]  cc: Darrick MeigsSangeeta Raghav Verrilli, MD, <Dictator> Darrick MeigsSANGEETA Wayman Hoard MD ELECTRONICALLY SIGNED 12/19/2011 12:12

## 2014-05-05 NOTE — Consult Note (Signed)
PATIENT NAME:  Mary Werner, BOTTOMLEY MR#:  [id number] DATE OF BIRTH:  October 04, 1945  DATE OF CONSULTATION:  12/15/2011  REFERRING PHYSICIAN:   CONSULTING PHYSICIAN:  Lurline Del, MD  REASON FOR CONSULTATION: Nausea and vomiting.   HISTORY OF PRESENT ILLNESS: A 69 year old female, resident of [facility] skilled nursing facility. The patient was transferred because of persistent nausea and vomiting. According to the patient, for the last couple of days she has been having some nausea and vomiting. She denies significant abdominal pain and mainly complaining of back pain. According to her, she has no trouble with diarrhea or constipation. CT scan of the abdomen and pelvis done in the emergency room raised some concerns about possible enteritis. Increased amount of air was noted in the stomach raising concerns about possible ileus. The patient's UA showed 2+ ketones, and more than 500 of glucose in the urine. Serum glucose was about 319. The patient is being admitted for further management.   PAST MEDICAL HISTORY:  Significant for: 1. Diabetes. 2. Hypertension.  3. Hyperlipidemia.  4. Chronic pain syndrome. 5. Depression. 6. Diabetic neuropathy. 7. Urinary incontinence.   ALLERGIES: Septra, tetracycline, and Terramycin.   SOCIAL HISTORY: She is a nursing home resident. Does not smoke or drink.   FAMILY HISTORY: Unremarkable.   MEDICATIONS AT HOME:  1. Abilify. 2. Aggrenox. 3. Atenolol. 4. Calcium. 5. Klonopin. 6. Flexeril. 7. Detrol. 8. Effexor. 9. Iron. 10. Januvia. 11. Potassium. 12. Lamictal. 13. Lisinopril. 14. Magnesium. 15. Metformin. 16. MS Contin. 17. Neurontin. 18. Norvasc. 19. Novolin.   20. NovoLog. 21. Oxycodone. 22. Senokot. 23. Simethicone. 24. Simvastatin. 25. Singulair. 26. Trazodone. 27. Victoza. 28. Vitamin B12.   REVIEW OF SYSTEMS: Positive for back pain. Denies diarrhea, constipation.  Review of systems is also positive for recent nausea,  vomiting, and mild abdominal pain.   PHYSICAL EXAMINATION:  GENERAL: Obese female. She does not appear to be in any acute distress. She is drinking her clear liquids.   HEENT: Grossly unremarkable.   NECK: Neck veins are flat.   LUNGS: Grossly clear to auscultation bilaterally.   CARDIOVASCULAR: Regular rate and rhythm.   ABDOMEN: Somewhat distended. Mild diffuse tenderness was noted. Bowel sounds are very sluggish. No rebound or guarding was noted.   NEUROLOGIC EXAMINATION: She is quite awake and alert.   LABORATORIES: White cell count was 19,000 on admission; today is 24,000. Hemoglobin was 13 on admission, is 10.9 after hydration. Serum glucose 319.  Troponin normal. Serum lipase normal. Serum glucose was 354 yesterday. Blood cultures are negative. Urinalysis showed more than 500 mg/dL of glucose, 2+ ketones, and positive protein. CT scan of the abdomen and pelvis showed multiple loops of distended small bowel with thickening of the jejunum and distal duodenum.  No evidence of obstruction. The stomach is air filled. Small amount of fluid within the pelvis. Some degree of fecal retention was noted within the colon. The main vessels, SMV,  SMA, and IMA, appear to be patent.   ASSESSMENT AND PLAN: Patient with acute onset nausea and vomiting and some abdominal pain. There is no diarrhea. She has significant leukocytosis. The patient may be dehydrated, and we may be dealing with a case of ketoacidosis as suggested by abnormal urinalysis and high blood sugars. CT scan is somewhat abnormal, although the increased amount of gas in the stomach may be secondary to gastroparesis. Some thickening of the wall of the small bowel was noted, raising concerns about enteritis. Exact etiology is not known. Fecal impaction may be  contributing as well.   I would recommend conservative management at this time with IV hydration. Okay to continue IV antibiotics. Repeat CBC in a.m. If patient has no bowel movement  in the next 24 to 48 hours, then I would recommend using enemas plus/minus laxatives. Mesenteric ischemia was considered but appears to be unlikely. A CT scan showed good opacification of major vessels. We will follow. Further recommendations to follow.   ____________________________ Lurline DelShaukat Glorianne Proctor, MD si:vtd D: 12/15/2011 18:02:00 ET T: 12/16/2011 09:34:33 ET JOB#: [id number]  cc: Lurline DelShaukat Doyce Stonehouse, MD, <Dictator> Lurline DelSHAUKAT Jolane Bankhead MD ELECTRONICALLY SIGNED 12/20/2011 11:25

## 2014-05-05 NOTE — Discharge Summary (Signed)
PATIENT NAME:  Mary Werner, MARCHETTI MR#:  [id number] DATE OF BIRTH:  12-Nov-1945  DATE OF ADMISSION:  10/20/2011 DATE OF DISCHARGE:  10/20/2011  PRESENTING COMPLAINT: Chest pain.   DISCHARGE DIAGNOSES:  1. Chest pain, appears atypical, could be musculoskeletal in the setting of chronic cough.  2. History of chronic pain syndrome with narcotic dependence.  3. Hyperlipidemia.  4. Morbid obesity.  5. Depression.  6. Type 2 diabetes.  7. History of right pelvic cystic mass.   CONDITION ON DISCHARGE: Fair.   CODE STATUS: FULL CODE.   VITAL SIGNS: Vitals are stable. Blood pressure 127/72, sats are 96% on room air.   LABORATORY, DIAGNOSTIC AND RADIOLOGICAL DATA: Cardiac enzymes x2 negative. Chest x-ray no acute cardiopulmonary abnormality. No alveolar infiltrate. CBC within normal limits. Comprehensive metabolic panel within normal limits. EKG showed normal sinus rhythm with left axis deviation and left ventricular hypertrophy with QRS widening and repolarization. No changes from previous EKGs.   DISCHARGE MEDICATIONS:  1. Milk of magnesium 30 mL p.o. daily.  2. Azithromycin 250 mg 2 tablets, finish up your course.  3. Calcium with vitamin D 1 tablet b.i.d.  4. Clonazepam 0.5 mg at bedtime.  5. Cranberry oral tablet 1 tablet 450 mg twice a day.  6. Cyclobenzaprine 1 tablet every eight hours.  7. Detrol 2 mg 1 tablet b.i.d.  8. Effexor-XR 225 mg p.o. daily.  9. Novolin R sliding scale.  10. Ferrous sulfate 325 mg 1 tablet b.i.d.  11. Januvia 100 mg daily.  12. Klor-Con 20 mEq p.o. daily.  13. Lamictal 25 mg 2 tablets twice a day.  14. Lisinopril 20 mg 1 tablet daily.  15. Magnesium oxide 1 tablet at bedtime.  16. Metformin 500 mg 1-1/2 tablets, that is 750 mg, p.o. b.i.d.  17. Aggrenox 25/200, 1 tablet b.i.d.  18. Abilify 10 mg 1/2 tablet, which is 5 mg, daily.  19. Atenolol 50 mg b.i.d.  20. MS Contin 15 mg 1 tablet at bedtime.  21. Mucinex DM 1 tablet b.i.d.  22. Multivitamin p.o.  daily.  23. Neurontin 100 mg at bedtime.  24. Norvasc 5 mg daily.  25. NovoLog R 28 units daily at noon.  26. NovoLog Mix 70/30, 72 units once a day in the morning and 60 units in the evening.  27. Oxycodone 5 mg b.i.d.  28. Senokot-S 1 tablet b.i.d.  29. Simvastatin 10 mg at bedtime.  30. Singulair 10 mg daily.   DIET: Low sodium, carbohydrate controlled diet.   FOLLOW UP: Patient will follow up with Dr. Terance Hart 10/15 at 10:45 a.m. and Dr. Lady Gary 10/21 at 10:45 a.m.   BRIEF SUMMARY OF HOSPITAL COURSE: Ms. Eckhart is a 69 year old morbidly obese Caucasian female with multiple medical problems comes to the Emergency Room from [facility] with complaints of chest pain. She was admitted with:  1. Chest pain. Patient's pain from her description appears atypical. It currently is resolved. She was ruled out for acute ACS/myocardial infarction with two sets of negative cardiac enzymes and no new EKG changes. Patient does not have a history of myocardial infarction, however, has risk factors of it. [facility] cardiology on call given her symptoms, appears atypical, recommend outpatient follow up. Patient has been set up for follow up with Dr. Lady Gary as outpatient.  2. Excessive drowsiness likely from polypharmacy and being awake in the ER all night. She awakens on verbal commands, answers all questions appropriately.  3. Hypertension. Continue home medications. Vitals remained stable.  4. Type  2 diabetes, on Januvia insulin 70/30 and sliding scale along with metformin.  5. Depression/anxiety. Will hold sedative meds for today, however, will resume it from tomorrow.   6. Morbid obesity.  7. Hyperlipidemia on simvastatin.  8. Chronic pain syndrome on MS Contin.  9. I did discuss with patient at length. She feels at baseline. She is agreeable to follow with cardiology as outpatient and is agreeable to go back to [facility] today. Hospital stay otherwise remained stable. CODE STATUS:  Patient remained a FULL CODE.   TIME SPENT: 40 minutes.  ____________________________ Wylie HailSona A. Allena KatzPatel, MD sap:cms D: 10/20/2011 12:23:50 ET T: 10/20/2011 12:43:25 ET JOB#: [id number]  cc: Keyerra Lamere A. Allena KatzPatel, MD, <Dictator> Teena Iraniavid M. Terance HartBronstein, MD Darlin PriestlyKenneth A. Lady GaryFath, MD Willow OraSONA A Olyvia Gopal MD ELECTRONICALLY SIGNED 10/25/2011 9:29

## 2014-05-05 NOTE — Consult Note (Signed)
Brief Consult Note: Diagnosis: ? Enteritis.   Patient was seen by consultant.   Consult note dictated.   Comments: Admitted with nausea and vomiting and abdominal pain. Feels better and denies significant symptoms. No diarrhea. CT suggestive of entritis with ileus. Significant leucocytosis. Abdominal examination is benign. ? Ketoacidosis.  Recommendations: Conservative management with IV hydration. Agree with antibiotics. Agree with clear liquids. Clinically doubt mesenteric ischemia although cosnider lactic acid and vascular consult if no significant improvement. Further recommendations to follow depending on her hospital course and repeat labs in am. Dr. Mechele CollinElliott will follow her over the weekend..  Electronic Signatures: Lurline DelIftikhar, Val Schiavo (MD)  (Signed ([phone]-Nov-13 18:02)  Authored: Brief Consult Note   Last Updated: 29-Nov-13 18:02 by Lurline DelIftikhar, Gwyn Hieronymus (MD)

## 2014-05-05 NOTE — H&P (Signed)
PATIENT NAME:  Mary Werner, Mary H MR#:  [id number] DATE OF BIRTH:  08-25-1945  DATE OF ADMISSION:  10/19/2011  PRIMARY CARE PHYSICIAN:  Dr. Terance HartBronstein. ER PHYSICIAN: Dr. Zenda AlpersWebster.  ADMITTING PHYSICIAN: Dr. Tilda FrancoAkuneme.   PRESENTING COMPLAINT: Chest pain.   HISTORY OF PRESENT ILLNESS: The patient is a 69 year old lady who presented with complaint of chest pain which started this morning. Pain was dull in nature, left-sided, radiated up towards the left shoulder. Denies any diaphoresis. No loss of consciousness. No PND, orthopnea, or pedal edema. No recent change in medication. No long distance travel. No history of trauma to the chest wall or rash. The patient stated that symptoms started while she was cleaning the room. For this, she presented to the Emergency Room and was referred to the hospitalist for further evaluation. Received nitroglycerin and aspirin with prompt relief of the pain.   REVIEW OF SYSTEMS: CONSTITUTIONAL: Denies fever, fatigue, weakness. No weight loss or weight gain. EYES: No blurred vision, redness or discharge. ENT: No tinnitus, epistaxis, discharge or difficulty swallowing. RESPIRATORY: Denies cough or wheezing. CARDIOVASCULAR: Positive for chest pain, but no palpitations. No syncopal episode. GASTROINTESTINAL: No nausea, vomiting, diarrhea, or abdominal pain. GU: No dysuria, frequency, or incontinence. ENDOCRINE: No polyuria, polydipsia, heat or cold intolerance. HEMATOLOGIC: No anemia, easy bruising or bleeding. SKIN: No rashes, change in hair or skin texture. MUSCULOSKELETAL: No joint pain, redness, or swelling. NEUROLOGIC: No numbness or headache. PSYCH: No anxiety or depression.   PAST MEDICAL HISTORY:  1. History of chronic pain syndrome.  2. Hyperlipidemia.  3. Hypertension.  4. Morbid obesity.  5. Depression.  6. Type 2 diabetes with diabetic neuropathy. 7. Generalized weakness. 8. History of cerebrovascular accident.  9. History of right-sided hydroureter. 10. Urinary  incontinence.   ALLERGIES: Sulfa, tetracycline, Terramycin and Septra.   SOCIAL HISTORY: No history of smoking tobacco or recreational drug use. Currently resides at home with her husband. 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   FAMILY HISTORY: Positive for coronary artery disease in the mother who died of massive heart attack. Father has chronic obstructive pulmonary disease with long term tobacco misuse.   MEDICATIONS:  1. MS Contin 50 mg at bedtime.  2. Oxycodone 5 mg twice a day.  3. Lisinopril 20 mg daily.  4. Milk of magnesia 30 mL daily.  5. Clonazepam 0.5 mg at bedtime. 6. Lamictal 25 mg (50 mg tablet) two tablets twice daily.  7. Neurontin 100 mg at bedtime. 8. Effexor XR 225 mg daily.  9. Januvia 100 mg daily.  10. Metformin 500 mg 1-1/2 tablet twice a day. 11. Novolin R 28 units daily. 12. Novolin R sliding scale insulin.  13. Novolin 70/30, 72 units once a day in the morning and 60 units in the evening.  14. Simvastatin 10 mg at bedtime. 15. Aggrenox 25/200, one capsule twice a day.  16. Abilify 10 mg tablets half-tablet daily. 17. Atenolol 50 mg twice a day. 18. Norvasc 5 mg once a day. 19. Cranberry oral tablet one tablet 450 mg twice a day.  20. Ferrous sulfate 325 mg b.i.d.  21. Senokot-S 1 tablet twice a day.  22. Singulair 10 mg daily.  23. Azithromycin 250 mg daily for four more days. 24. Klor-Con 20 mEq 1 tablet daily.  25. Magnesium oxide 400 mg daily at bedtime.  26. Cyclobenzaprine 5 mg q.8 hours p.r.n. for muscle weakness. 27. Mucinex DM 60/1200 mg tablet one tablet twice daily.  28. Detrol 2 mg twice daily.  29. Calcium D 1 tablet twice a day.  30. Multivitamin once daily.   PHYSICAL EXAMINATION:  VITAL SIGNS: Temperature 98.7, pulse 77, respiratory rate 16, blood pressure 144/69, oxygen saturation 95% on room air.   GENERAL: Obese elderly lady lying on a gurney, very drowsy, sleeping heavily, only answering a few questions and then  goes back to sleep.   HEENT: Atraumatic, normocephalic. Pupils equal, reactive to light and accommodation. Extraocular movement intact. Mucous membranes pink, moist.   NECK: Supple. No JV distention.   CHEST: Good air entry. Few transmitted breath sounds. No rhonchi, no rales.   HEART: Regular rate and rhythm. No murmur.   ABDOMEN: Obese, pendulous, moves with respiration, nontender. Bowel sounds normoactive. No organomegaly.   EXTREMITIES: No edema, clubbing, or deformity.   NEUROLOGICAL: No focal deficits. The patient is heavily drowsy in the Emergency Room. Able to move all limbs spontaneously.   PSYCH: Affect flat.   LABORATORY, DIAGNOSTIC, AND RADIOLOGICAL DATA: EKG showed normal sinus rhythm, rate of 78, with left bundle branch block which is unchanged from previous one in July 2013. Chest x-ray showed no gross infiltrates. CBC unremarkable. Chemistry unremarkable. Potassium 4.2, creatinine 0.9, glucose 120, calcium 9.8. Troponin first set is 0.02. Second one is 0.04. CK 66.   IMPRESSION:  1. Chest pain, rule out ACS versus arrhythmia. Consider gastroesophageal reflux disease versus musculoskeletal.  2. Excessive drowsiness, most likely from polypharmacy.  3. Hypertension, stable.  4. Type 2 diabetes, stable.  5. Depression/anxiety, stable, probably overmedicated.  6. Morbid obesity.  7. Hyperlipidemia.  8. Chronic pain syndrome.   PLAN:  1. Admit to general medical floor under observation.  2. Check TSH, magnesium, fasting lipid profile, BNP, telemonitoring, consider stress test if no recent stress test in the last one year. Records currently down at this time. For aspirin, nitroglycerin p.r.n., morphine, Lovenox therapy.  3. Gastrointestinal prophylaxis with Protonix.  4. Deep vein thrombosis prophylaxis with Lovenox. Home Medications adjusted to avoid oversedation.  CODE STATUS: FULL CODE.   TOTAL PATIENT CARE TIME: 50 minutes.   ____________________________ Floy Sabina Tilda Franco, MD mia:ap D: 10/20/2011 08:35:33 ET T: 10/20/2011 08:50:13 ET JOB#: [id number]  cc: Sharelle Burditt I. Tilda Franco, MD, <Dictator> Teena Irani. Terance Hart, MD Margaret Pyle MD ELECTRONICALLY SIGNED 10/21/2011 0:27

## 2014-05-05 NOTE — Consult Note (Signed)
Pt denies any rectal bleeding, none over the weekend.  mild abd distention but no signif tenderness.  I will sign off and please reconsult if needed.  Bleed likely diverticular or hemorrhoidal and has no stopped.  Electronic Signatures: Scot JunElliott, Robert T (MD)  (Signed on 15-Jul-13 18:31)  Authored  Last Updated: 15-Jul-13 18:31 by Scot JunElliott, Robert T (MD)

## 2014-05-05 NOTE — Consult Note (Signed)
Chief Complaint:   Subjective/Chief Complaint Some intermittent abdominal pain. No vomiting. No BM in last several days.  Recommendations: Will add Miralax. Continue antibiotics. Will follow.   Electronic Signatures: Lurline DelIftikhar, Mitsuru Dault (MD)  (Signed 02-Dec-13 17:41)  Authored: Chief Complaint   Last Updated: 02-Dec-13 17:41 by Lurline DelIftikhar, Dijon Kohlman (MD)

## 2014-05-08 NOTE — Consult Note (Signed)
Brief Consult Note: Diagnosis: Nausea, vomiting and ? coffee ground emesis.   Patient was seen by consultant.   Orders entered.   Comments: Ptaient with h/o recurrent vomiting for years admitted with nausea and vomiting for several days. Vomitus was reported to be black. No melena. H and H normal. WBC count elevated. Abdominal examination is benign.  Recommendations: CT abdomen and pelvis. IV Protonix. Hold Agrenox and Heparin. Control of nausea and vomiting. Follow H and H. Further recommendations to follow.  Electronic Signatures: Lurline DelIftikhar, Wylie Russon (MD)  (Signed 05-Apr-14 13:48)  Authored: Brief Consult Note   Last Updated: 05-Apr-14 13:48 by Lurline DelIftikhar, Yandel Zeiner (MD)

## 2014-05-08 NOTE — Consult Note (Signed)
Chief Complaint:  Subjective/Chief Complaint No further vomiting. No BM per patient who is very sleepy.   VITAL SIGNS/ANCILLARY NOTES: **Vital Signs.:   06-Apr-14 05:39  Vital Signs Type Q 4hr  Temperature Temperature (F) 98.5  Celsius 36.9  Temperature Source AdultAxillary  Pulse Pulse 93  Respirations Respirations 18  Systolic BP Systolic BP 681  Diastolic BP (mmHg) Diastolic BP (mmHg) 75  Mean BP 89  Pulse Ox % Pulse Ox % 87  Pulse Ox Activity Level  At rest  Oxygen Delivery Room Air/ 21 %   Lab Results: Routine Chem:  06-Apr-14 04:15   Glucose, Serum  47  BUN 11  Creatinine (comp) 0.96  Sodium, Serum  135  Potassium, Serum 3.7  Chloride, Serum 99  CO2, Serum 30  Calcium (Total), Serum  8.1  Anion Gap  6  Osmolality (calc) 267  eGFR (African American) >60  eGFR (Non-African American) >60 (eGFR values <71m/min/1.73 m2 may be an indication of chronic kidney disease (CKD). Calculated eGFR is useful in patients with stable renal function. The eGFR calculation will not be reliable in acutely ill patients when serum creatinine is changing rapidly. It is not useful in  patients on dialysis. The eGFR calculation may not be applicable to patients at the low and high extremes of body sizes, pregnant women, and vegetarians.)  Routine Hem:  06-Apr-14 04:15   WBC (CBC)  11.3  RBC (CBC)  3.13  Hemoglobin (CBC)  9.6  Hematocrit (CBC)  28.9  Platelet Count (CBC) 266  MCV 92  MCH 30.7  MCHC 33.2  RDW 13.2  Neutrophil % 55.3  Lymphocyte % 31.0  Monocyte % 12.2  Eosinophil % 1.4  Basophil % 0.1  Neutrophil # 6.2  Lymphocyte # 3.5  Monocyte #  1.4  Eosinophil # 0.2  Basophil # 0.0 (Result(s) reported on 21 Apr 2012 at 05:36AM.)   Radiology Results: CT:    05-Apr-14 13:30, CT Abdomen and Pelvis With Contrast  CT Abdomen and Pelvis With Contrast   REASON FOR EXAM:    (1) abd pain, vomiting; (2) abd pain, vomiting  COMMENTS:       PROCEDURE: CT  - CT ABDOMEN /  PELVIS  W  - Apr 20 2012  1:30PM     RESULT: Axial CT scanning was performed through the abdomen and pelvis   with reconstructions at 3 mm intervals and slice thicknesses. The patient   received 85 cc of Isovue-300 and also received a small amount of oral   contrast material. Review of multiplanar reconstructed images was   performed separately on the VIA monitor. Comparison is made to studies of   September and November 2013.     There is chronic dilation of the renal pelvis and ureter on the right.   There is a stable appearing fluid density structure in the upper right   adnexal region which is closely applied to the right ureter. The urinary     bladder demonstrates a diffusely thickened wall which has become more   conspicuous since the previous study. The renal parenchyma enhances   normally bilaterally. There is likely an exophytic lower pole cyst on the   left measuring just over 1 cm in diameter.     The liver exhibits no focal mass nor ductal dilation. The gallbladder is   not visible and is likely surgically absent. The spleen is not enlarged.   The pancreas exhibits no evidence of acute inflammation. There are no  adrenal masses. The caliber of the abdominal aorta is normal.     The stomach is partially distended with fluid and contrast and gas. The   partially contrast-filled loops of small and large bowel exhibit no   evidence of ileus nor of obstruction. The uterus is surgically absent.   There is no left adnexal mass. The cystic appearing structure described   in the right upper pelvis may be associated with the right adnexal     structures.     There is no free fluid in the pelvis. There is no inguinal nor   significant umbilical hernia. The lung bases exhibit no acute   abnormalities. The lumbar vertebral bodies are preserved in height.    IMPRESSION:   1. There is no evidence of bowel obstruction or ileus. The stomach is   mildly distended with fluid but only  a small amount of oral contrast.   There is subjective mild thickening of the pylorus and proximal duodenum   at this portion the bowel is not well distended.  2. There is no acute hepatobiliary abnormality. The gallbladder is   assumed to be surgically absent.  3. There is a stable appearing cystic right upper adnexal mass measuring   6.5 x 4.1 cm.  4. There is stable chronic hydronephrosis and hydroureter on the left.   There is increased conspicuity of bladder wall thickeningdiffusely. No   bladder stones or masses are demonstrated.     Dictation Site: 5        Verified By: DAVID A. Martinique, M.D., MD   Assessment/Plan:  Assessment/Plan:  Assessment Recurrent vomiting with ? coffee ground emesis. No signs of active GI bleed. H and H is somewhat lower. CT with some thickning in distal stomach and proximal small bowel.   Plan EGD in am. Further recommendations to follow. Discussed with primary team.   Electronic Signatures: Jill Side (MD)  (Signed 06-Apr-14 09:42)  Authored: Chief Complaint, VITAL SIGNS/ANCILLARY NOTES, Lab Results, Radiology Results, Assessment/Plan   Last Updated: 06-Apr-14 09:42 by Jill Side (MD)

## 2014-05-08 NOTE — Consult Note (Signed)
Chief Complaint:  Subjective/Chief Complaint EGD showed severe esophageal ulcerations most likely reflux induced. No blood or active bleeding.  Impression: ? Upper GI bleed secondary to esophageal ulcers. No signs of active bleeding. Mild anemia. Nausea and vomiting, better. most likey a combination of UTI and Esophagitis.  Recommendations: Continue PPI as OP. Avoid Aggrenox if acceptable. Low dose ASA can be used instead. Advance diet. No further recommendations. Will sign off. Please call us if needed. Thanks.   Electronic Signatures: Lurline DelIftikhar, Eniyah Eastmond (MD)  (Signed 07-Apr-14 17:46)  Authored: Chief Complaint   Last Updated: 07-Apr-14 17:46 by Lurline DelIftikhar, Vashaun Osmon (MD)

## 2014-05-08 NOTE — H&P (Signed)
DATE OF BIRTH:  [birth year]  DATE OF ADMISSION:  04/20/2012  PRIMARY CARE PHYSICIAN:  Dr. Terance Hart  CHIEF COMPLAINT:  Nausea, vomiting, blood in the vomiting.   HISTORY OF PRESENT ILLNESS: A 69 year old female patient who currently resides at a skilled nursing facility, presented to the hospital, sent in with nausea and vomiting. The patient has had problems with nausea and vomiting chronically, which have worsened over the past 2 days. She also had abdominal pain. The nursing staff noticed blood in the vomiting at the nursing home, and sent her to the Emergency Room. Here, patient has been found to have a UTI. She has nonradiating abdominal pain, 10/10, which is also chronic, but worsened over 2 days. No shortness of breath. No melena. Hemoglobin is stable, but elevated white count. No fever, but tachycardic. Has UTI and sepsis.   PAST MEDICAL HISTORY: Diabetes, hypertension, hyperlipidemia, chronic pain syndrome, narcotic dependent, depression, urinary incontinence, diabetic neuropathy, chronic vomiting.   ALLERGIES:  SEPTRA, SULFA, TETRACYCLINE, TERRAMYCIN.   SOCIAL HISTORY:  Does not smoke. No alcohol. No illicit drugs. Is a resident of a skilled nursing facility.   CODE STATUS:  FULL CODE.   FAMILY HISTORY:  She mentions that her mom and dad died of heart problems.   REVIEW OF SYSTEMS: CONSTITUTIONAL:  No fever, no weight gain, weight loss.  EYES:  No blurred vision, pain, redness.  EARS, NOSE, THROAT:  No tinnitus, ear pain, hearing loss.  RESPIRATORY:  No cough, wheeze, hemoptysis.  CARDIOVASCULAR:  No chest pain, orthopnea, palpitations.  GASTROINTESTINAL:  Has nausea, vomiting, hematemesis and abdominal pain.  GENITOURINARY:  No dysuria, hematuria.  ENDOCRINE:  No polyuria, nocturia, thyroid problems.  HEMATOLOGIC AND LYMPHATIC:  No anemia, easy bruising, bleeding.  INTEGUMENTARY:  No acne, rash, lesions.  MUSCULOSKELETAL: No arthritis, but does have chronic back pain.   NEUROLOGIC:  No focal numbness, weakness, dysarthria.  PSYCHIATRY:  Has depression, no anxiety.   HOME MEDICATIONS INCLUDE: 1.  Acetaminophen 650 mg oral every 4 hours as needed.  2.  Aggrenox 1 capsule oral 2 times a day.  3.  DuoNeb 3 mL every 6 hours as needed.  4.  Atenolol 50 mg oral 2 times a day.  5.  Benztropine 0.5 mg oral 2 times a day.  6.  Calcium/vitamin D 500/200 international units oral 2 times a day.  7.  Cranberry oral 1 tablet 2 times a day.  8.  Detrol 2 mg oral 2 times a day.  9.  Effexor 150 mg oral once a day.  10.  Ferrous sulfate 325 mg oral once a day.  11.  Fluphenazine 2.5 mg oral 2 times a day.  12.  Januvia 100 mg oral once a day.  13.  Potassium chloride 20 mEq oral once a day.  14.  Lamictal 25 mg 2 tablets oral 2 times a day.  15.  Lisinopril 20 mg oral once a day.  16.  Magnesium oxide 400 mg oral 2 times a day.  17.  Multivitamin 1 tablet oral once a day.  18.  NovoLog 70/30, 72 units in the morning and 60 units in the evening.  19.  Norvasc 5 mg oral once a day. 20.  Ondansetron 4 mg oral every 6 hours as needed for nausea.  21.  Oxycodone 5 mg oral every 4 hours as needed for pain.  22.  Protonix 40 mg oral once a day.  23.  Polyethylene glycol 17 grams oral once a day as needed.  24.  Promethazine 25 mg oral every 6 hours as needed for nausea.  25.  Singulair 10 mg oral once a day.  26.  Trazodone 150 mg oral once a day at bedtime.  27.  Victoza 1.2 mg subcutaneous once a day.  28.  Vitamin B 12 1000 mcg oral once a day.   PHYSICAL EXAMINATION: VITAL SIGNS: Shows temperature 99.6, pulse of 111, blood pressure 140/76, saturating 93% on room air.  GENERAL:  Obese Caucasian female patient lying in bed, restless secondary to the pain.  PSYCHIATRIC:  Alert and oriented x 3, restless,  poor judgment.  HEENT:  Atraumatic, normocephalic. Oral mucosa dry and pink. External ears and nose normal. No pallor. No icterus. Pupils bilaterally equal and  react to light.  NECK:  Supple. No thyromegaly. No palpable lymph nodes. Trachea midline. No carotid bruit, JVD.  CARDIOVASCULAR: S1, S2. Tachycardic, without any murmurs. Peripheral pulses 2+. No edema.  RESPIRATORY:  Normal work of breathing. Clear to auscultation on both sides.  GASTROINTESTINAL:  Tenderness in the epigastric area. Normal bowel sounds. Soft. No rigidity or guarding. No hepatosplenomegaly palpable. Scar is present.  GENITOURINARY:  No CVA tenderness or bladder distention.  SKIN:  Warm and dry. No petechiae, rash, ulcers.  NEUROLOGICAL: Motor strength 5/5 in upper and lower extremities. Sensation to fine touch  intact all over.  LYMPHATIC:  No cervical lymphadenopathy.   LAB STUDIES: Show glucose of 171, BUN 19, creatinine 1.08, sodium 131, potassium 3.5, chloride 90, bicarbonate of 35. GFR 53. AST, ALT, alkaline phosphatase, bilirubin normal. Troponin less than 0.02. WBC 21.8, hemoglobin 12.3, platelets of 328. Urinalysis shows 789 WBC and 2+ bacteria, along with 48 RBC.   Chest x-ray portable shows no acute abnormalities.   ASSESSMENT AND PLAN: 1.  Urinary tract infection with sepsis. Will admit the patient for IV ceftriaxone along with aggressive IV fluid resuscitation. The patient is tachycardic, significantly elevated white count and low-grade fevers of 99.3. Await blood and urine cultures.   2.  Hematemesis. This could be secondary to Mallory-Weiss tear. Hemoglobin is stable. Consult GI for further help with the case. Monitor for any further bleeds. Hold the Aggrenox patient is on. No heparin products. Will check a CT abdomen to look for any colitis or enteritis, which patient had in the past with her history of abdominal pain and sepsis.   3.  Dehydration. Start on IV fluids. This is secondary to her vomiting and decreased intake.   4.  Hypertension. Continue medications.   5.  Diabetes mellitus. Continue patient's home medications. Decrease the insulin dose, as she is  not able to eat.   6.  Chronic pain syndrome. Continue patient's medications.   7.  Deep vein thrombosis prophylaxis with sequential compression devices. No heparin products secondary to concern for gastrointestinal bleed.   8.  Code status:  FULL CODE.   Time spent today on this case was 55 minutes.     ____________________________ Molinda BailiffSrikar R. Georganne Siple, MD srs:mr D: 04/20/2012 15:03:38 ET T: 04/20/2012 15:42:36 ET JOB#: [id number]  cc: Wardell HeathSrikar R. Cassara Nida, MD, <Dictator> Teena Iraniavid M. Terance HartBronstein, MD  Orie FishermanSRIKAR R Lashaundra Lehrmann MD ELECTRONICALLY SIGNED 04/22/2012 14:56

## 2014-05-08 NOTE — Consult Note (Signed)
PATIENT NAME:  Mary Werner, Mary Werner MR#:  [id number] DATE OF BIRTH:  Jan 15, 1946  DATE OF CONSULTATION:  04/20/2012  CONSULTING PHYSICIAN:  Lurline DelShaukat Anwyn Kriegel, MD  REASON FOR CONSULTATION: Nausea, vomiting and questionable coffee-ground emesis.   HISTORY OF PRESENT ILLNESS: A 69 year old female who resides at a skilled nursing facility. There are some notes in the computer that suggest that she probably induces vomiting on her own. The patient was transferred to the ED and was subsequently admitted because of nausea and vomiting and what appears to be coffee-ground emesis. According to the patient, she has been having trouble with nausea and vomiting for 5 years. The patient was found to have urinary tract infection with leukocytosis. Her hemoglobin and hematocrit are normal. No other significant symptoms such as abdominal pain, diarrhea, constipation, melena or bright red blood per rectum were reported by the patient.   PAST MEDICAL HISTORY: Significant for diabetes, hypertension, hyperlipidemia, depression, urinary incontinence, diabetic neuropathy and chronic vomiting.   ALLERGIES: SEPTRA, TETRACYCLINE AND TERRAMYCIN.   SOCIAL HISTORY: She does not smoke, does not drink and does not use any illicit drugs.   FAMILY HISTORY: Unremarkable.   REVIEW OF SYSTEMS: Negative except for what is mentioned in the history of present illness.   PHYSICAL EXAMINATION:  GENERAL: Well built female, somewhat obese, does not appear to be in any acute distress.  VITAL SIGNS: Heart rate is in 90s. Temperature is 98.1, respirations 18 to 20, blood pressure 146/84.  LUNGS: Clear.  SKIN: Unremarkable without any jaundice.  CARDIOVASCULAR: Regular rate and rhythm.  ABDOMEN: Soft. Bowel sounds are positive. No significant tenderness was noted. No rebound or guarding.  EXTREMITIES: No edema, clubbing or cyanosis.  NEUROLOGICAL: Appears to be fairly unremarkable.   LABORATORY AND RADIOLOGICAL DATA: Serum lipase is 72.  Troponin is normal. Electrolytes are fairly unremarkable. Creatinine is normal. Liver enzymes are unremarkable as well. White cell count is 21,000, rest of the CBC is normal. Urinalysis is abnormal with 789 white blood cells per high-power field. CT scan of the abdomen and pelvis was done, which showed questionable thickening in the gastric antrum and the proximal small bowel, although this is quite insignificant, otherwise unremarkable.   ASSESSMENT AND PLAN: This is a patient with nausea, vomiting. The patient appears to have a urinary tract infection with large amount of WBCs in the urinalysis and leukocytosis. The nausea and vomiting may very well be related to urinary tract infection. Coffee-ground emesis was reported, although the patient's hemoglobin and hematocrit are normal and she has no melena. I doubt significant upper gastrointestinal bleeding. Gastritis or duodenitis is a possibility, especially since it is also suggested by CT scan. I would recommend treating the urinary tract infection. Start the patient on Protonix 40 mg intravenously once or twice a day. Hold Aggrenox and Lovenox due to concerns about possible mild gastrointestinal bleed. Further recommendations to follow.   ____________________________ Lurline DelShaukat Ali Mohl, MD si:jm D: 04/20/2012 16:21:30 ET T: 04/20/2012 21:48:06 ET JOB#: [id number]  cc: Lurline DelShaukat Master Touchet, MD, <Dictator> Lurline DelSHAUKAT Shyana Kulakowski MD ELECTRONICALLY SIGNED 04/21/2012 17:29

## 2014-05-08 NOTE — Discharge Summary (Signed)
PATIENT NAME:  Mary Werner, Mary H MR#:  [id number] DATE OF BIRTH:  02-25-1945  DATE OF ADMISSION:  04/20/2012 DATE OF DISCHARGE:  04/23/2012  PRESENTING COMPLAINT: Nausea, vomiting.   DISCHARGE DIAGNOSES: 1.  Nausea and vomiting, resolved, suspected due to acute gastritis as noted on esophagogastroduodenoscopy.  2.  Enterococcus faecalis urinary tract infection.  3.  Hypertension.  4.  Type 2 diabetes.  5.  History of stroke, substance abuse and depression.   CONDITION ON DISCHARGE: Fair.   VITALS SIGNS: Stable. Sats are 94% percent on room air.   LABS AT DISCHARGE: Ammonia is 25.   White count is 11.3, hemoglobin and hematocrit 9.6 and 28.9, platelet count is 266. Creatinine is 0.96, sodium is 137, potassium is 3.7. His abdominal CT shows no evidence of bowel obstruction or ileus. Stomach is mildly distended with fluid but only a small amount of oral contrast. There is no acute hepatobiliary abnormality. The gallbladder is assumed to be surgically absent. There is a stable cyst in the right upper adnexal mass measuring 6.5 x 4.1 cm. There is stable chronic hydronephrosis and hydroureter on the left. There is increased conspicuity of the bladder wall thickening diffusely.   Blood cultures negative in 48 hours.   UA positive for UTI. Urine culture positive for Enterococcus faecalis. LFTs within normal limits. White count on admission was 21,000.   PROCEDURES: Upper GI endoscopy showed gastritis. Few linear esophageal ulcers with no bleeding stigmata were noted. Localized mild inflammation of erythema was found in the gastric antrum.  BRIEF SUMMARY OF HOSPITAL COURSE: Mary Werner is a 69 year old Caucasian female with multiple medical problems, who comes in from skilled nursing facility because of nausea and vomiting. She had some abdominal pain as well. She was also noted to have UTI. She was admitted with:  1.  Acute intractable nausea and vomiting. The patient was started on IV fluids. Given her  abnormal stomach wall thickening, she was seen by Dr. Niel HummerIftikhar, recommended upper GI endoscopy, which showed nonbleeding small esophageal ulcers, along with gastritis. The patient was continued on IV Protonix; however, changed to p.o. Protonix prior to discharge. The patient did not have any more vomiting and did not have any episodes of hematemesis.  2.  Urinary tract infection Enterococcus faecalis. Nitrofurantoin has been prescribed at discharge.  3.  Leukocytosis secondary to UTI, improved prior to discharge.  4.  Chronic pain syndrome. The patient's pain meds were continued.  5.  Type 2 diabetes, on insulin. The patient's insulin has been resumed.  6.  Dehydration, resolved after IV fluids.   Hospital stay otherwise remained stable.   CODE STATUS: The patient remains a FULL CODE.   TIME SPENT: Forty minutes.    ____________________________ Wylie HailSona A. Allena KatzPatel, MD sap:lg D: 04/23/2012 12:55:27 ET T: 04/23/2012 14:54:01 ET JOB#: [id number]  cc: Glendale Wherry A. Allena KatzPatel, MD, <Dictator> Teena Iraniavid M. Terance HartBronstein, MD Willow OraSONA A Marenda Accardi MD ELECTRONICALLY SIGNED 04/24/2012 14:18

## 2014-05-10 NOTE — Op Note (Signed)
PATIENT NAME:  [name], Mary H MR#:  [id number] DATE OF BIRTH:  1945-02-10  DATE OF PROCEDURE:  07/27/2011  PREOPERATIVE DIAGNOSIS: Lower GI bleed.   POSTOPERATIVE DIAGNOSIS: Lower GI bleed.   PROCEDURE PERFORMED: Right internal jugular venous access under ultrasound guidance.   SURGEON: Quentin Orealph L. Ely III, MD  ANESTHESIA: Local.   DESCRIPTION OF PROCEDURE: With the patient in supine position and after the induction of appropriate prep and local anesthesia, the patient's right neck was prepped with ChloraPrep and draped with sterile towels. Using ultrasound guidance the neck was interrogated and the right internal jugular vein was identified. Appeared to be patent and compressible. The vein was cannulated on the second pass and a wire passed into the right heart without difficulty. Incision was enlarged and the dilator placed over the wire into the right great vessel system. Dilator was removed and the catheter placed over the wire into the great vessel system. All ports were flushed without difficulty. The device was sutured in place in two places and sterilely dressed. Chest x-ray is pending.   ____________________________ Carmie Endalph L. Ely III, MD rle:cms D: 07/27/2011 19:25:06 ET T: 07/28/2011 09:07:24 ET JOB#: [id number]  cc: Quentin Orealph L. Ely III, MD, <Dictator> Quentin OreALPH L ELY MD ELECTRONICALLY SIGNED 08/01/2011 7:52

## 2014-05-10 NOTE — Consult Note (Signed)
Chief Complaint:   Subjective/Chief Complaint No BM. No bleeding.   VITAL SIGNS/ANCILLARY NOTES: **Vital Signs.:   14-Jul-13 11:56   Temperature Temperature (F) 98   Celsius 36.6   Temperature Source oral   Pulse Pulse 94   Respirations Respirations 18   Systolic BP Systolic BP 148   Diastolic BP (mmHg) Diastolic BP (mmHg) 71   Mean BP 96   Pulse Ox Activity Level  At rest   Oxygen Delivery 3L; Nasal Cannula   Lab Results: Routine Chem:  14-Jul-13 05:30    Result Comment LABS - This specimen was collected through an   - indwelling catheter or arterial line.  - A minimum of 5mls of blood was wasted prior    - to collecting the sample.  Interpret  - results with caution.  Result(s) reported on 30 Jul 2011 at 05:49AM.  Routine Hem:  14-Jul-13 05:30    Hemoglobin (CBC)  10.3   Assessment/Plan:  Assessment/Plan:   Assessment Rectal bleeding, resolved. H and H stable. Most likely hemorrhoidal or diverticular. UTI.    Plan Continue to observe. No intervention planned. Dr. Mechele CollinElliott will resume care in am.   Electronic Signatures: Lurline DelIftikhar, Likisha Alles (MD)  (Signed 14-Jul-13 13:29)  Authored: Chief Complaint, VITAL SIGNS/ANCILLARY NOTES, Lab Results, Assessment/Plan   Last Updated: 14-Jul-13 13:29 by Lurline DelIftikhar, Chi Garlow (MD)

## 2014-05-10 NOTE — Consult Note (Signed)
Pt passing loose blood tinged stool, flexiflo inserted.  Will get stool cultures and C. diff studies.  Hgb up to 11 after transfusion.  WBC 10.6, abd less tender on left than last night.  BP ran low and dopamine started, on 4 mics at this time with good BP.  No new GI recommendations. No plans for endoscopic evaluation at this time.  Hx of diverticulosis.  Await cultures.  Dr. Niel HummerIftikhar to cover this weekend.  Electronic Signatures: Scot JunElliott, Daimian Sudberry T (MD)  (Signed on 12-Jul-13 07:23)  Authored  Last Updated: 12-Jul-13 07:23 by Scot JunElliott, Nykeem Citro T (MD)

## 2014-05-10 NOTE — Consult Note (Signed)
Chief Complaint:   Subjective/Chief Complaint Overall feels better. No new complaints. Not sure if there was any blood in stool this morning.   VITAL SIGNS/ANCILLARY NOTES: **Vital Signs.:   13-Jul-13 11:15   Vital Signs Type Upon Transfer   Temperature Temperature (F) 98.6   Celsius 37   Temperature Source axillary   Pulse Pulse 89   Pulse source if not from Vital Sign Device brachial   Respirations Respirations 20   Systolic BP Systolic BP 147   Diastolic BP (mmHg) Diastolic BP (mmHg) 80   Mean BP 113   BP Source  if not from Vital Sign Device non-invasive   Pulse Ox % Pulse Ox % 95   Pulse Ox Activity Level  At rest   Oxygen Delivery 3L   Brief Assessment:   Additional Physical Exam Abdomen is somewhat distended. BS positive. No rebound.   Lab Results: Routine Chem:  13-Jul-13 06:42    Glucose, Serum  232   BUN  21   Creatinine (comp) 1.18   Sodium, Serum 141   Potassium, Serum 3.7   Chloride, Serum 106   CO2, Serum 28   Calcium (Total), Serum  8.4   Anion Gap 7   Osmolality (calc) 292   eGFR (African American)  56   eGFR (Non-African American)  48 (eGFR values <100m/min/1.73 m2 may be an indication of chronic kidney disease (CKD). Calculated eGFR is useful in patients with stable renal function. The eGFR calculation will not be reliable in acutely ill patients when serum creatinine is changing rapidly. It is not useful in  patients on dialysis. The eGFR calculation may not be applicable to patients at the low and high extremes of body sizes, pregnant women, and vegetarians.)   Magnesium, Serum  1.1 (1.8-2.4 THERAPEUTIC RANGE: 4-7 mg/dL TOXIC: > 10 mg/dL  -----------------------)  Routine Hem:  13-Jul-13 06:42    WBC (CBC) 8.7   RBC (CBC)  3.19   Hemoglobin (CBC)  9.5   Hematocrit (CBC)  29.4   Platelet Count (CBC) 244   MCV 92   MCH 29.9   MCHC 32.4   RDW 13.8   Neutrophil % 43.5   Lymphocyte % 37.7   Monocyte % 15.5   Eosinophil % 2.9   Basophil  % 0.4   Neutrophil # 3.8   Lymphocyte # 3.3   Monocyte #  1.3   Eosinophil # 0.3   Basophil # 0.0 (Result(s) reported on 29 Jul 2011 at 07:08AM.)   Assessment/Plan:  Assessment/Plan:   Assessment UTI. Hypotension probably secondary to above. Rectal bleeding. No signs of active bleeding and H and H is is slightly lower than the admossion H and H.    Plan Will continue to observe. Follow H and H. Colonoscopy in 2010 showed left sided diverticulosis.   Electronic Signatures: IJill Side(MD)  (Signed 13-Jul-13 13:28)  Authored: Chief Complaint, VITAL SIGNS/ANCILLARY NOTES, Brief Assessment, Lab Results, Assessment/Plan   Last Updated: 13-Jul-13 13:28 by IJill Side(MD)

## 2014-05-10 NOTE — H&P (Signed)
PATIENT NAME:  Mary Werner, Mary Werner MR#:  [id number] DATE OF BIRTH:  01/08/1946  DATE OF ADMISSION:  07/27/2011  PRIMARY CARE PHYSICIAN: Dorothey Baseman, MD   CHIEF COMPLAINT: Rectal bleeding and weakness and altered mental status.   HISTORY OF PRESENT ILLNESS: This is a 69 year old female who resides at [facility] skilled nursing facility who presented to the hospital with rectal bleeding for the past two days and also noted to be hypotensive, lethargic, and hypoglycemic when she came to the ER.   The patient herself is a bit lethargic. Therefore, the history is limited. But as per the patient, she has been having rectal bleeding for the past two days and therefore the skilled nursing facility was concerned and sent her over to ER. In the Emergency Room, she was noted to have grossly heme positive stools and shortly after that became hypotensive which improved with some IV fluids. She then became a bit lethargic and was noted to be hypoglycemic which has since then resolved with an amp of dextrose. She continues to be slightly hypotensive and was noted to be in mild renal failure and also noted to have a significant urinary tract infection. Hospitalist service was then contacted for further treatment and evaluation.   The patient presently denies any chest pain, any shortness of breath, any nausea or vomiting, abdominal pain, or any other associated symptoms presently.   REVIEW OF SYSTEMS: CONSTITUTIONAL: No documented fever. No weight gain. No weight loss. EYES: No blurred or double vision. ENT: No tinnitus. No postnasal drip. No redness of the oropharynx. RESPIRATORY: No cough, no wheeze, no hemoptysis, and no dyspnea. CARDIOVASCULAR: No chest pain, no orthopnea, no palpitations, and no syncope. GI: No nausea, no vomiting, and no diarrhea. Positive hematochezia. No melena. GENITOURINARY: No dysuria and no hematuria. ENDOCRINE: No polyuria or nocturia. No heat or cold intolerance. HEME: No anemia. No  bruising. No acute bleeding. INTEGUMENTARY: No rashes. No lesions. MUSCULOSKELETAL: No arthritis, no swelling, and no gout. NEUROLOGIC: No numbness, no tingling, no ataxia, and no seizure type activity. PSYCH: Positive anxiety. Positive depression. No ADD.    PAST MEDICAL HISTORY:  1. Diabetes. 2. Hypertension. 3. Hyperlipidemia. 4. Morbid obesity. 5. History of previous stroke. 6. Chronic pain. 7. Anxiety/depression. 8. History of right-sided hydroureter.  9. Urinary incontinence. 10. Diabetic neuropathy.   ALLERGIES: Sulfa drugs and tetracycline.   SOCIAL HISTORY: No smoking. No alcohol abuse. No illicit drug abuse. She  currently resides at [facility] skilled nursing facility.   FAMILY HISTORY: The patient's mother died from a massive heart attack. Father had a history of chronic obstructive pulmonary disease with long-time tobacco abuse.   CURRENT MEDICATIONS:  1. Abilify 10 mg 1/2 tab daily.  2. Aggrenox 1 tab twice a day. 3. Atenolol 50 mg twice a day. 4. Calcium with vitamin D 1 tab twice a day. 5. Klonopin 0.5 mg at bedtime. 6. Detrol 2 mg twice a day. 7. Iron sulfate 325 mg twice a day.  8. Flonase two sprays to each nostril daily.  9. Gabapentin 100 mg at bedtime.  10. Januvia 100 mg daily. 11. Potassium 20 milliequivalents daily. 12. Lamictal 25 mg 2 tabs twice a day. 13. Lisinopril 20 mg daily.  14. Magnesium oxide 400 mg daily.  15. Metformin 750 mg twice a day.  16. Milk of Magnesia at bedtime as needed.  17. MS Contin 15 mg at bedtime.  18. Multivitamin daily.  19. Norvasc 5 mg daily. 20. Novolin Regular 28 units daily.  21. Novolin 70/30, 80 units in the morning and 60 units in the evening.  22. Oxycodone 5 mg twice a day. 23. Senokot 1 tab twice a day. 24. Simvastatin 10 mg daily.  25. Singulair 10 mg daily.  26. Trazodone 175 mg at bedtime.  27. Effexor 225 mg extended release 1 tab daily.  28. Vitamin B12 1000 mcg daily.   PHYSICAL  EXAMINATION:  VITALS: Temperature 98.4, pulse 79, respirations 16, blood pressure 97/47, and saturation 92% on room air.   GENERAL: She is a lethargic appearing female but in no apparent distress.   HEENT: Atraumatic, normocephalic. Extraocular muscles are intact. Pupils are equal and reactive to light. Sclerae anicteric. No conjunctival injection. No pharyngeal erythema.   NECK: Supple. No jugular venous distention, no bruits, no lymphadenopathy, and no thyromegaly.   HEART: Regular rate and rhythm. No murmurs, rubs, or clicks.   LUNGS: Clear to auscultation bilaterally. No rales, rhonchi, and no wheezes.   ABDOMEN: Soft, flat, nontender, and nondistended. Has good bowel sounds. No hepatosplenomegaly appreciated.   EXTREMITIES: No evidence of any cyanosis or clubbing. Does have +1 to 2 pitting edema from the knees to the ankles bilaterally. +2 pedal and radial pulses bilaterally.   NEUROLOGICAL: The patient is alert, awake, and oriented x2, globally weak, difficult to do a full neurological exam but moves all extremities spontaneously.   SKIN: Moist and warm with no rashes.   LYMPHATIC: There is no cervical or axillary lymphadenopathy.   LABORATORY, DIAGNOSTIC AND RADIOLOGIC DATA: Serum glucose 133, BUN 40, creatinine 1.6, sodium 135, potassium 3.7, chloride 98, and bicarbonate 26. LFTs are within normal limits. White cell count 7.8, hemoglobin 9.4, hematocrit 32.9, and platelet count 287.   Urinalysis is positive for 2+ leukocytes with over 4000 white cells with 3+ bacteria.   The patient also had a chest x-ray done which showed interstitial pulmonary edema and widening of the mid and superior mediastinum likely secondary to lung volumes.   ASSESSMENT AND PLAN: This is a 69 year old female with past medical history of morbid obesity, hypertension, diabetes, chronic pain, history of previous cerebrovascular accident, history of recurrent UTIs, and history of right-sided hydroureter who  came into the hospital due to rectal bleeding for the past few days and also noted to be hypotensive with a urinary tract infection.  1. Altered mental status. Likely multifactorial in nature. Suspected hypoglycemia versus urinary tract infection versus underlying hypotension. We will treat all. We will continue IV fluids to treat her hypotension and give her IV antibiotics for the urinary tract infection. Her sugars have already improved with an amp of dextrose. We will continue to follow frequent fingersticks and follow her mental status closely.  2. Gastrointestinal bleed, likely a lower GI bleed given bright red blood per rectum. The patient had a colonoscopy in 2010 done by Dr. Marva Panda which showed diverticulosis. For now we will hold her Aggrenox, follow serial hemoglobins, get a gastroenterology consultation, and transfuse her as needed. She will be getting 1 unit of blood as per the ER physician.  3. Urinary tract infection. We will treat her with IV ceftriaxone and follow urine cultures. 4. Acute renal failure. This is likely ATN related to hypotension and also volume loss. We will give her IV fluids, transfuse her as needed, follow BUN and creatinine and urine output, renal dose her medications, avoid nephrotoxins, and hold her ACE inhibitor for now.  5. Hypotension. Questionable if this is related to hypovolemia versus septic shock. Continue IV fluids,  transfuse as needed, continue IV ceftriaxone to treat the urinary tract infection, and follow hemodynamics, and I will hold all her blood pressure medications for now.  6. Diabetes with hypoglycemia. The patient was hypoglycemic with a sugar of 18 in the ER. She received one dose of dextrose. Her sugars have improved. Questionable if this is actually related to sepsis. I will hold all her antidiabetic medications including her insulin, her Januvia, and her metformin for now. Follow blood sugars closely. Follow frequent fingersticks.  7. Chronic  pain. Continue MS Contin.  8. Anxiety/depression. Continue Abilify, Lamictal, and Klonopin.  9. Hyperlipidemia. Continue simvastatin.   CODE STATUS: FULL CODE.  TIME SPENT ON ADMISSION: 50 minutes. ____________________________ Rolly PancakeVivek J. Cherlynn KaiserSainani, MD vjs:slb D: 07/27/2011 17:24:54 ET T: 07/27/2011 17:44:09 ET JOB#: [id number]  cc: Rolly PancakeVivek J. Cherlynn KaiserSainani, MD, <Dictator> Teena Iraniavid M. Terance HartBronstein, MD Houston SirenVIVEK J SAINANI MD ELECTRONICALLY SIGNED 07/28/2011 15:47

## 2014-05-10 NOTE — Consult Note (Signed)
PATIENT NAME:  Mary Werner, BANSAL MR#:  [id number] DATE OF BIRTH:  05/16/1945  DATE OF CONSULTATION:  07/27/2011  REFERRING PHYSICIAN:   CONSULTING PHYSICIAN:  Scot Jun, MD  HISTORY OF PRESENT ILLNESS: Patient is a 69 year old white female who is a resident of [facility] skilled nursing facility presented to the hospital with rectal bleeding for a couple of days as well as a urinary tract infection, hypotension, lethargy. She was noted in the ER to have low blood pressure and was given IV fluids. She was noted on rectal exam to have grossly heme positive stool. I was asked to see her in consultation for this.   Patient had a colonoscopy with Dr. Marva Panda in June 2010 that showed large diverticulosis of the sigmoid and descending colon.   She has a history of multiple medical problems including diabetes, recurrent urinary tract infections, chronic anxiety and depression.   Patient is a questionably valid historian. She answers either two weeks or two days to all questions about duration of symptoms. She was found to be hypoglycemic in the ER and was given D50 that may account for some of her difficulty with history. There had been no observed or reported hematemesis. She told admitting physician she had been passing some blood for a couple of days, she told me for a couple of weeks. Because of low blood pressure and history of some rectal bleeding and heme positive stool she was started on a unit of blood in the ER. Patient is on Aggrenox and this was held after admission. Plan is to do serial hemoglobins after the initial unit of blood.   REVIEW OF SYSTEMS: Patient complains of pain in the right mid abdomen. She complains of vomiting within the last two days, complains of diarrhea within the last two days and rectal bleeding.   ALLERGIES: Sulfa drugs and tetracycline.   PAST MEDICAL HISTORY:  1. Previous stroke. 2. Diabetes.  3. Hypertension.  4. Obesity.  5. Right-sided hydroureter.   6. Urinary incontinence. 7. Diabetic neuropathy.  8. Diverticulosis.   HABITS: Does not smoke. Does not drink.   FAMILY HISTORY: Mother died from a heart attack. Father had chronic obstructive pulmonary disease.   MEDICATIONS ON ADMISSION:  1. Abilify 10 mg 1/2 tablet a day.  2. Aggrenox 1 tablet twice a day.  3. Atenolol 50 mg twice a day.  4. Calcium with vitamin D 1 tablet twice a day.  5. Klonopin 0.5 mg at bedtime. 6. Detrol 2 mg twice a day. 7. Iron sulfate 325 twice a day.  8. Flonase two sprays each nostril daily.  9. Gabapentin 100 mg at bedtime.  10. Januvia 100 mg a day.  11. Potassium 20 mEq a day.  12. Lamictal 25 mg 2 tablets twice a day.  13. Lisinopril 20 mg a day.  14. Magnesium oxide 400 mg a day.  15. Metformin 750 mg twice a day.  16. Milk of magnesia at bedtime.  17. MS Contin 15 mg a day at bedtime.  18. Multivitamins daily.  19. Norvasc 5 mg daily.  20. Novolin regular 28 units a day.  21. Novolin 70/30, 80 units in the morning, 60 units in the evening. 22. Oxycodone 5 mg twice a day.  23. Senokot 1 tablet twice a day.  24. Simvastatin 10 mg daily. 25. Singulair 10 mg a day.  26. Trazodone 175 mg at bedtime.  27. Effexor 225 mg extended release 1 tablet daily. 28. Vitamin B12 1000 mcg daily.  PHYSICAL EXAMINATION:  GENERAL: White female looks older than stated age.   VITAL SIGNS: Blood pressure 90/44, pulse 90, respirations 20, pulse oximetry 98% on 2 liters.   HEAD: Atraumatic. Sclerae nonicteric. Conjunctivae negative. Tongue is negative.   NECK: She has an internal jugular line in the right side of the neck.   CHEST: Clear in anterior fields.   HEART: No murmurs or gallops I can hear.   ABDOMEN: There is tenderness in the right mid abdomen. Abdomen somewhat obese.   EXTREMITIES: 1+ edema.   SKIN: Warm and dry.   RECTAL: Exam done by ER physician showed gross blood.  LABORATORY, DIAGNOSTIC AND RADIOLOGICAL DATA: Glucose 104, BUN  40, creatinine 1.69, sodium 135, potassium 3.7, chloride 98, CO2 26, total protein 7.5, albumin 2.9, total bilirubin 0.4, alkaline phosphatase 76, SGOT 10, SGPT 17, white count 11.8, hemoglobin 10.4 at 12:47 today repeat to two hours later is 9.4, hematocrit 32.9, platelet count 287. A- blood with negative antibody screen. Urinalysis 2+ leukocyte esterase, 4600 white cells per high-powered field. Chest x-ray showed no acute disease of the chest.   ASSESSMENT:  1. I suspect given her pink conjunctivae and tongue that her hypotension is not totally related to GI bleeding and may be in good part due to urinary tract infection and possible sepsis. Agree with and treatment of this.  2. Would check a hemoglobin one hour after her current unit of blood and if it is above 9 and there is no further bleeding will hold blood for now. Her elevated BUN to creatinine ratio may be an indication of blood in the GI tract possibly from upper or lower source. Patients with diabetes and chronic medical conditions and chronic renal insufficiency are prone to more common peptic ulcer disease and gastritis. Her bleeding could be coming from diverticulosis as well. Nursing home patients are sometimes infected with C. difficile colitis. Sometimes other infectious diseases may be seen more in nursing homes as well.   3. At this time. I have no plans for an upper or lower endoscopy. Will follow with you.  ____________________________ Scot Junobert T. Ravis Herne, MD rte:cms D: 07/27/2011 19:50:19 ET T: 07/28/2011 05:28:09 ET JOB#: [id number]  cc: Scot Junobert T. Dwyne Hasegawa, MD, <Dictator> Scot JunOBERT T Rockelle Heuerman MD ELECTRONICALLY SIGNED 08/01/2011 17:36
